# Patient Record
Sex: Male | Born: 1990 | Race: White | Hispanic: No | Marital: Married | Smoking: Never smoker
Health system: Southern US, Community
[De-identification: ages and names within clinical notes are randomized; demographics above are authoritative.]

## PROBLEM LIST (undated history)

## (undated) DIAGNOSIS — K449 Diaphragmatic hernia without obstruction or gangrene: Secondary | ICD-10-CM

## (undated) DIAGNOSIS — Z9289 Personal history of other medical treatment: Secondary | ICD-10-CM

## (undated) DIAGNOSIS — M542 Cervicalgia: Secondary | ICD-10-CM

## (undated) DIAGNOSIS — K219 Gastro-esophageal reflux disease without esophagitis: Secondary | ICD-10-CM

## (undated) DIAGNOSIS — K2970 Gastritis, unspecified, without bleeding: Secondary | ICD-10-CM

## (undated) HISTORY — DX: Gastritis, unspecified, without bleeding: K29.70

## (undated) HISTORY — DX: Cervicalgia: M54.2

## (undated) HISTORY — DX: Diaphragmatic hernia without obstruction or gangrene: K44.9

## (undated) HISTORY — DX: Personal history of other medical treatment: Z92.89

---

## 2011-09-20 ENCOUNTER — Ambulatory Visit (INDEPENDENT_AMBULATORY_CARE_PROVIDER_SITE_OTHER): Payer: 59 | Admitting: Family Medicine

## 2011-09-20 ENCOUNTER — Encounter: Payer: Self-pay | Admitting: Family Medicine

## 2011-09-20 VITALS — BP 114/70 | HR 70 | Ht 68.5 in | Wt 142.0 lb

## 2011-09-20 DIAGNOSIS — Z0000 Encounter for general adult medical examination without abnormal findings: Secondary | ICD-10-CM

## 2011-09-20 DIAGNOSIS — M542 Cervicalgia: Secondary | ICD-10-CM

## 2011-09-20 DIAGNOSIS — R110 Nausea: Secondary | ICD-10-CM

## 2011-09-20 DIAGNOSIS — M549 Dorsalgia, unspecified: Secondary | ICD-10-CM

## 2011-09-20 NOTE — Patient Instructions (Signed)
heat, stretching and anti-inflammatory twice. Try Prilosec and let me know how this works.

## 2011-09-20 NOTE — Progress Notes (Signed)
Subjective:    Patient ID: Richard Trujillo, male    DOB: 12-09-1990, 21 y.o.   MRN: [id number]  HPI He is here for complete examination. Approximately one month ago he did notice a tearing sensation in the left neck and head pain after that but no numbness, tingling or weakness. The symptoms are improved. He also complains of a long history of low back discomfort. He does treat this symptomatically and has had no major difficulties from this. He also has difficulty with nausea. He's had no vomiting or diarrhea. Food sometimes makes it better and at other times worse. He does say that Pepto-Bismol has helped. This has been going on for several months. He also has a rash present on his left lateral leg that he has been using an antifungal on for the last 2 months. He is now no longer having any difficulty. He did have some routine blood screening done at work.   Review of Systems  Constitutional: Negative.   HENT: Negative.   Eyes: Negative.   Respiratory: Negative.   Cardiovascular: Negative.   Genitourinary: Negative.   Musculoskeletal: Positive for back pain.  Skin: Negative.   Hematological: Negative.   Psychiatric/Behavioral: Negative.        Objective:   Physical Exam BP 114/70  Pulse 70  Ht 5' 8.5" (1.74 m)  Wt 142 lb (64.411 kg)  BMI 21.28 kg/m2  General Appearance:    Alert, cooperative, no distress, appears stated age  Head:    Normocephalic, without obvious abnormality, atraumatic  Eyes:    PERRL, conjunctiva/corneas clear, EOM's intact, fundi    benign  Ears:    Normal TM's and external ear canals  Nose:   Nares normal, mucosa normal, no drainage or sinus   tenderness  Throat:   Lips, mucosa, and tongue normal; teeth and gums normal  Neck:   Supple, no lymphadenopathy;  thyroid:  no   enlargement/tenderness/nodules; no carotid   bruit or JVD  Back:    Spine nontender, no curvature, ROM normal, no CVA     tenderness  Lungs:     Clear to auscultation bilaterally without  wheezes, rales or     ronchi; respirations unlabored  Chest Wall:    No tenderness or deformity   Heart:    Regular rate and rhythm, S1 and S2 normal, no murmur, rub   or gallop  Breast Exam:    No chest wall tenderness, masses or gynecomastia  Abdomen:     Soft, non-tender, nondistended, normoactive bowel sounds,    no masses, no hepatosplenomegaly  Genitalia:    Normal male external genitalia without lesions.  Testicles without masses.  No inguinal hernias.  Rectal:   Deferred due to age <40 and lack of symptoms  Extremities:   No clubbing, cyanosis or edema  Pulses:   2+ and symmetric all extremities  Skin:   Skin color, texture, turgor normal, no rashes or lesions  Lymph nodes:   Cervical, supraclavicular, and axillary nodes normal  Neurologic:   CNII-XII intact, normal strength, sensation and gait; reflexes 2+ and symmetric throughout          Psych:   Normal mood, affect, hygiene and grooming.           Assessment & Plan:   1. Routine general medical examination at a health care facility   2. Neck pain, acute   3. Nausea alone   4. Back pain    recommend heat, stretching and anti-inflammatory. Also discussed proper  posturing and strengthening. Recommend Prilosec for the nausea. If the skin lesion reoccurs, recommend he continue on the antifungal medication.

## 2011-10-12 ENCOUNTER — Telehealth: Payer: Self-pay

## 2011-10-12 NOTE — Telephone Encounter (Signed)
He called last night because of difficulty with abdominal pain. He was taking Prilosec which helped with his reflux symptoms but he thinks caused the pain. I switched him to Nexium and the pain is gone and now he has a pressure sensation in the midepigastric area. I encouraged him to stay on the Nexium and call me at the beginning of the week.

## 2011-10-12 NOTE — Telephone Encounter (Signed)
nexium help some last nite and today still doesn't feel right feeling tight please advise on what to do he took the rest of the day off today please advise

## 2011-10-17 ENCOUNTER — Telehealth: Payer: Self-pay | Admitting: Internal Medicine

## 2011-10-17 NOTE — Telephone Encounter (Signed)
He states that he is roughly 70% better. I will have him continue on the medication for at least the next 2 weeks. He is then to taper back on his medication to every other day and then to every 3 days to keep his symptoms under control. If this does not work he is to call me.

## 2011-10-18 ENCOUNTER — Other Ambulatory Visit: Payer: Self-pay

## 2011-10-18 ENCOUNTER — Emergency Department (HOSPITAL_COMMUNITY)
Admission: EM | Admit: 2011-10-18 | Discharge: 2011-10-19 | Disposition: A | Discharge status: Home Health | Payer: 59 | Attending: Emergency Medicine | Admitting: Emergency Medicine

## 2011-10-18 ENCOUNTER — Encounter (HOSPITAL_COMMUNITY): Payer: Self-pay | Admitting: *Deleted

## 2011-10-18 DIAGNOSIS — R071 Chest pain on breathing: Secondary | ICD-10-CM | POA: Insufficient documentation

## 2011-10-18 DIAGNOSIS — R0781 Pleurodynia: Secondary | ICD-10-CM

## 2011-10-18 NOTE — ED Notes (Signed)
Pt states  He started to have sharp left chest pain about two hours ago. Pt states he has some numbness in his left hand. Pt denies any sob or n/v. Pt states he was started on nexium last week

## 2011-10-19 ENCOUNTER — Emergency Department (HOSPITAL_COMMUNITY): Payer: 59

## 2011-10-19 ENCOUNTER — Ambulatory Visit (INDEPENDENT_AMBULATORY_CARE_PROVIDER_SITE_OTHER): Payer: 59 | Admitting: Family Medicine

## 2011-10-19 ENCOUNTER — Telehealth: Payer: Self-pay

## 2011-10-19 VITALS — BP 126/88 | HR 82 | Ht 68.5 in | Wt 137.0 lb

## 2011-10-19 DIAGNOSIS — M940 Chondrocostal junction syndrome [Tietze]: Secondary | ICD-10-CM

## 2011-10-19 LAB — POCT I-STAT TROPONIN I: Troponin i, poc: 0 ng/mL (ref 0.00–0.08)

## 2011-10-19 LAB — POCT I-STAT, CHEM 8
BUN: 10 mg/dL (ref 6–23)
Calcium, Ion: 1.19 mmol/L (ref 1.12–1.32)
Chloride: 103 mEq/L (ref 96–112)
Creatinine, Ser: 0.9 mg/dL (ref 0.50–1.35)
Glucose, Bld: 106 mg/dL — ABNORMAL HIGH (ref 70–99)
HCT: 45 % (ref 39.0–52.0)
Hemoglobin: 15.3 g/dL (ref 13.0–17.0)
Potassium: 3.5 mEq/L (ref 3.5–5.1)
Sodium: 141 mEq/L (ref 135–145)
TCO2: 25 mmol/L (ref 0–100)

## 2011-10-19 MED ORDER — NAPROXEN 500 MG PO TABS: 500.0000 mg | ORAL_TABLET | Freq: Two times a day (BID) | ORAL | Status: DC

## 2011-10-19 NOTE — Progress Notes (Signed)
  Subjective:    Patient ID: Richard Trujillo, male    DOB: July 04, 1991, 21 y.o.   MRN: [id number]  HPI He was seen last night in the emergency room for evaluation of intermittent left-sided pleuritic type chest pain. The emergency room record was reviewed. He does continue to have difficulty with this. He describes the pain is worse with breathing and also there is some slight point tenderness over the left costochondral junction on at least 2 different sites. He's had no shortness of breath, diaphoresis, weakness. He continues to have difficulty with his abdominal pain but states that he is doing much better than when it initially started.   Review of Systems     Objective:   Physical Exam Alert and in no distress. Slight chest wall tenderness noted over the third and fourth costochondral junctions. Lungs clear to auscultation. Cardiac exam shows regular rhythm without murmurs or gallops.       Assessment & Plan:   1. Acute costochondritis    recommend he continue on naproxen. Also discussed possibly adding an H2 blocker during the middle of the day however if continued difficulty, referral will be made to GI.

## 2011-10-19 NOTE — Telephone Encounter (Signed)
Pt coming in

## 2011-10-19 NOTE — Patient Instructions (Signed)
Stay on naproxen and had 2 Tylenol 4 times per day

## 2011-10-19 NOTE — Discharge Instructions (Signed)
Your x-ray, EKG and blood work have all been normal. Please take Naprosyn or ibuprofen for pain, followup with your doctor as needed. Please see the attached reading instructions regarding your chest pain. If your symptoms should worsen or become severe return to the emergency department immediately

## 2011-10-19 NOTE — ED Provider Notes (Signed)
History     CSN: [id number]  Arrival date & time 10/18/11  2251   First MD Initiated Contact with Patient 10/19/11 0008      Chief Complaint  Patient presents with  . Chest Pain    (Consider location/radiation/quality/duration/timing/severity/associated sxs/prior treatment) HPI Comments: 21 year old male with no significant past medical history presents with sharp left-sided chest pain. This was acute in onset while he was laying down for the evening. It is intermittent, lasts approximately 1-2 seconds and resolves. It happens multiple times an hour and seems to be worse with moving from side to side, stretching his arms. It is not related with taking a deep breath, there's been no fevers shortness of breath coughing abdominal pain or swelling in the legs. There is been no trauma, travel, surgery, immobilization, hormone use, smoking.  Patient is a 21 y.o. male presenting with chest pain. The history is provided by the patient.  Chest Pain Pertinent negatives for primary symptoms include no fever, no shortness of breath, no cough, no abdominal pain, no nausea and no vomiting.  Pertinent negatives for associated symptoms include no numbness and no weakness.     History reviewed. No pertinent past medical history.  History reviewed. No pertinent past surgical history.  No family history on file.  History  Substance Use Topics  . Smoking status: Never Smoker   . Smokeless tobacco: Never Used  . Alcohol Use: No      Review of Systems  Constitutional: Negative for fever and chills.  HENT: Negative for sore throat and neck pain.   Eyes: Negative for visual disturbance.  Respiratory: Negative for cough and shortness of breath.   Cardiovascular: Positive for chest pain.  Gastrointestinal: Negative for nausea, vomiting, abdominal pain and diarrhea.  Genitourinary: Negative for dysuria and frequency.  Musculoskeletal: Negative for back pain.  Skin: Negative for rash.    Neurological: Negative for weakness, numbness and headaches.  Hematological: Negative for adenopathy.  Psychiatric/Behavioral: Negative for behavioral problems.    Allergies  Review of patient's allergies indicates no known allergies.  Home Medications   Current Outpatient Rx  Name Route Sig Dispense Refill  . ESOMEPRAZOLE MAGNESIUM 40 MG PO CPDR Oral Take 40 mg by mouth daily before breakfast.    . NAPROXEN 500 MG PO TABS Oral Take 1 tablet (500 mg total) by mouth 2 (two) times daily with a meal. 30 tablet 0    BP 132/75  Pulse 95  Temp 98.6 F (37 C)  Resp 22  SpO2 100%  Physical Exam  Nursing note and vitals reviewed. Constitutional: He appears well-developed and well-nourished. No distress.  HENT:  Head: Normocephalic and atraumatic.  Mouth/Throat: Oropharynx is clear and moist. No oropharyngeal exudate.  Eyes: Conjunctivae and EOM are normal. Pupils are equal, round, and reactive to light. Right eye exhibits no discharge. Left eye exhibits no discharge. No scleral icterus.  Neck: Normal range of motion. Neck supple. No JVD present. No thyromegaly present.  Cardiovascular: Normal rate, regular rhythm, normal heart sounds and intact distal pulses.  Exam reveals no gallop and no friction rub.   No murmur heard. Pulmonary/Chest: Effort normal and breath sounds normal. No respiratory distress. He has no wheezes. He has no rales.  Abdominal: Soft. Bowel sounds are normal. He exhibits no distension and no mass. There is no tenderness.  Musculoskeletal: Normal range of motion. He exhibits no edema and no tenderness.  Lymphadenopathy:    He has no cervical adenopathy.  Neurological: He is alert. Coordination  normal.  Skin: Skin is warm and dry. No rash noted. No erythema.  Psychiatric: He has a normal mood and affect. His behavior is normal.    ED Course  Procedures (including critical care time)  ED ECG REPORT   Date: 10/19/2011   Rate: 97  Rhythm: normal sinus  rhythm  QRS Axis: normal  Intervals: normal  ST/T Wave abnormalities: normal  Conduction Disutrbances:none  Narrative Interpretation:   Old EKG Reviewed: none available   Labs Reviewed  POCT I-STAT, CHEM 8 - Abnormal; Notable for the following:    Glucose, Bld 106 (*)    All other components within normal limits  POCT I-STAT TROPONIN I   Dg Chest 2 View  10/19/2011  *RADIOLOGY REPORT*  Clinical Data: Sudden onset left chest pain.  CHEST - 2 VIEW  Comparison: None.  Findings: Normal sized heart.  Clear lungs with normal vascularity. Minimal central peribronchial thickening.  Normal appearing bones.  IMPRESSION: Minimal bronchitic changes.  Original Report Authenticated By: Darrol Angel, M.D.     1. Pleuritic chest pain       MDM  Pulmonary cardiac exams both are normal without murmurs rubs gallops or tachycardia. He has no swelling in the lower extremities, the pain is very short-lived lasting one to 2 seconds at the most, there is no shortness of breath cough fever or swelling of the legs. His EKG appears normal with a pulse of 97 no significant other maladies. I suspect he is having a small amount of pleurisy. Proceed with chest x-ray, troponin, chemistry. Patient has perc negative    X-ray reviewed by myself showing no acute findings, vital signs again appear normal, patient appears calm and comfortable, laboratory workup shows normal troponin, negative electrolytes, EKG was unremarkable. Will discharge home as patient is extremely low risk for pulmonary embolism and cardiac disease  Vida Roller, MD 10/19/11 0110

## 2011-10-19 NOTE — ED Notes (Signed)
Patient transported to X-ray 

## 2011-10-19 NOTE — ED Notes (Signed)
Pt states pain to L mid/upper chest. Denies radiation. Pain intermittent. Describes as tight. Pt was at rest when pain started. Pt is a non-smoker.

## 2011-10-21 ENCOUNTER — Telehealth: Payer: Self-pay | Admitting: *Deleted

## 2011-10-21 ENCOUNTER — Ambulatory Visit (INDEPENDENT_AMBULATORY_CARE_PROVIDER_SITE_OTHER): Payer: 59 | Admitting: Internal Medicine

## 2011-10-21 ENCOUNTER — Encounter: Payer: Self-pay | Admitting: Internal Medicine

## 2011-10-21 VITALS — BP 102/62 | HR 60 | Ht 68.0 in | Wt 136.0 lb

## 2011-10-21 DIAGNOSIS — R1013 Epigastric pain: Secondary | ICD-10-CM

## 2011-10-21 DIAGNOSIS — R630 Anorexia: Secondary | ICD-10-CM

## 2011-10-21 MED ORDER — HYOSCYAMINE SULFATE 0.125 MG SL SUBL: SUBLINGUAL_TABLET | SUBLINGUAL | Status: DC

## 2011-10-21 NOTE — Patient Instructions (Signed)
You have been scheduled for an upper endoscopy on 10/24/11.  Please see the separate instructions provided to you at your office visit I have sent your prescription to your pharmacy You have been scheduled for an abdominal ultrasound at [facility] 10/25/11 8:00.  Please arrive at 7:45 and register in the Radiology department.  You need to have nothing to eat or drink after midnight on 10/25/11

## 2011-10-21 NOTE — Telephone Encounter (Signed)
Moved patient to an earlier available appointment.  He will arrive at 1330 for a 1430 appointment.

## 2011-10-21 NOTE — Progress Notes (Signed)
Subjective:    Patient ID: Richard Trujillo, male    DOB: 04-22-1991, 21 y.o.   MRN: [id number]  HPI Richard Trujillo is a 21 yo male with no significant PMH who is seen in consultation at the request of Dr. Susann Givens for evaluation of epigastric abd pain.  The patient reports epigastric abdominal pain over the last several months. He also describes this as a "upset and uncomfortable" stomach. He first noticed this a year ago, and it lasted a couple months and seem to resolve. At that point he took no treatment or medication. Than a couple months ago this returned, initially he took omeprazole x14 days OTC which seemed to help the pain. After the medication was stopped he developed more severe epigastric pain, seeking care at that point. He was started on Nexium 40 mg daily, and he reports some improvement, however subsequently his pain worsened again. This is a daily pain for him, and it tends to be worse in the early afternoon. He doesn't think it really relates to being, and doesn't seem to relate to bowel movement. Occasionally it is worse with deep breath or movement. His bowel movements have been normal for him and he denies diarrhea or constipation. No blood per rectum or melena. He reports some subjective fevers and chills, but notes he has not measured his temperature. He denies nausea or vomiting. No dysphagia. No heartburn. He does report significant anorexia without significant weight loss. He does report frequent belching. He was seen recently by primary care in the emergency department and has been given treatment with Naprosyn for possible costochondritis. He also was told he may have pleurisy.  Review of Systems Constitutional: See HPI HEENT: Negative for sore throat, mouth sores and trouble swallowing. Eyes: Negative for visual disturbance Respiratory: Positive for nonproductive cough x 1 day, no chest tightness and positive for mild shortness of breath Cardiovascular: Negative for chest pain,  palpitations and lower extremity swelling Gastrointestinal: See history of present illness Genitourinary: Negative for dysuria and hematuria. Musculoskeletal: Negative for back pain, arthralgias and myalgias Skin: Negative for rash or color change Neurological: Negative for headaches, weakness, numbness Hematological: Negative for adenopathy, negative for easy bruising/bleeding Psychiatric/behavioral: Negative for depressed mood, negative for anxiety   History reviewed. No pertinent past medical history.  Current Outpatient Prescriptions  Medication Sig Dispense Refill  . esomeprazole (NEXIUM) 40 MG capsule Take 40 mg by mouth daily before breakfast.      . hyoscyamine (LEVSIN/SL) 0.125 MG SL tablet Place 1-2 tablets under your tongue every 4-6 hours for abdominal pain  30 tablet  0   No Known Allergies  Family History  Problem Relation Age of Onset  . Heart disease Paternal Grandmother     Social History  . Marital Status: Single   Occupational History  . [employer]   Social History Main Topics  . Smoking status: Never Smoker   . Smokeless tobacco: Never Used  . Alcohol Use: No  . Drug Use: No  . Sexually Active: Yes    Birth Control/ Protection: Condom, Diaphragm      Objective:   Physical Exam BP 102/62  Pulse 60  Ht 5\' 8"  (1.727 m)  Wt 136 lb (61.689 kg)  BMI 20.68 kg/m2 Constitutional: Well-developed and well-nourished. No distress. HEENT: Normocephalic and atraumatic. Oropharynx is clear and moist. No oropharyngeal exudate. Conjunctivae are normal. Pupils are equal round and reactive to light. No scleral icterus. Neck: Neck supple. Trachea midline. Cardiovascular: Normal rate, regular rhythm and  intact distal pulses. No M/R/G Pulmonary/chest: Effort normal and breath sounds normal. No wheezing, rales or rhonchi. There is no significant tenderness to palpation over the ribs or sternum Abdominal: Soft, mild epigastric pain to deep palpation without rebound  or guarding, nondistended. Bowel sounds active throughout. There are no masses palpable. No hepatosplenomegaly. Extremities: no clubbing, cyanosis, or edema Lymphadenopathy: No cervical adenopathy noted. Neurological: Alert and oriented to person place and time. Skin: Skin is warm and dry. No rashes noted. Psychiatric: Normal mood and affect. Behavior is normal.  CBC    Component Value Date/Time   HGB 15.3 10/19/2011 0052   HCT 45.0 10/19/2011 0052   CMP     Component Value Date/Time   NA 141 10/19/2011 0052   K 3.5 10/19/2011 0052   CL 103 10/19/2011 0052   GLUCOSE 106* 10/19/2011 0052   BUN 10 10/19/2011 0052   CREATININE 0.90 10/19/2011 0052   Imaging Reviewed; CHEST - 2 VIEW, 10/19/11   Comparison: None.   Findings: Normal sized heart.  Clear lungs with normal vascularity. Minimal central peribronchial thickening.  Normal appearing bones.   IMPRESSION: Minimal bronchitic changes.     Assessment & Plan:  21 yo male with no significant PMH who is seen in consultation at the request of Dr. Susann Givens for evaluation of epigastric abd pain  1. Epigastric pain -- the patient's pain seems to have responded to PPI at first, however is no longer. The differential includes peptic ulcer disease, gastritis, or even gallbladder disease, but his constellation of symptoms are somewhat nonspecific. It is also possible that his symptoms are not GI related at all. We have discussed further workup, and I have recommended complete abdominal ultrasound and an upper endoscopy. We discussed EGD today including the risks and benefits and he is agreeable to proceed. I like him to continue Nexium 40 mg daily, and I've instructed him to take this 30 minutes to one hour before his first meal. I also will give him a trial of Levsin 0.125 mg every 4-6 hours when necessary abdominal pain/spasm. Vastus she call back if his symptoms change or worsen prior to this workup. He voiced understanding.

## 2011-10-24 ENCOUNTER — Encounter: Payer: Self-pay | Admitting: Internal Medicine

## 2011-10-24 ENCOUNTER — Other Ambulatory Visit: Payer: 59 | Admitting: Internal Medicine

## 2011-10-24 ENCOUNTER — Ambulatory Visit (AMBULATORY_SURGERY_CENTER): Payer: 59 | Admitting: Internal Medicine

## 2011-10-24 VITALS — BP 128/70 | HR 80 | Temp 98.1°F | Resp 20 | Ht 68.0 in | Wt 136.0 lb

## 2011-10-24 DIAGNOSIS — R1013 Epigastric pain: Secondary | ICD-10-CM

## 2011-10-24 DIAGNOSIS — K2970 Gastritis, unspecified, without bleeding: Secondary | ICD-10-CM

## 2011-10-24 DIAGNOSIS — K2990 Gastroduodenitis, unspecified, without bleeding: Secondary | ICD-10-CM

## 2011-10-24 DIAGNOSIS — D131 Benign neoplasm of stomach: Secondary | ICD-10-CM

## 2011-10-24 HISTORY — PX: ESOPHAGOGASTRODUODENOSCOPY: SHX1529

## 2011-10-24 MED ORDER — SODIUM CHLORIDE 0.9 % IV SOLN: 500.0000 mL | INTRAVENOUS | Status: DC

## 2011-10-24 NOTE — Patient Instructions (Signed)
YOU HAD AN ENDOSCOPIC PROCEDURE TODAY AT THE [facility]: Refer to the procedure report that was given to you for any specific questions about what was found during the examination.  If the procedure report does not answer your questions, please call your gastroenterologist to clarify.  If you requested that your care partner not be given the details of your procedure findings, then the procedure report has been included in a sealed envelope for you to review at your convenience later.  YOU SHOULD EXPECT: Some feelings of bloating in the abdomen. Passage of more gas than usual.  Walking can help get rid of the air that was put into your GI tract during the procedure and reduce the bloating. If you had a lower endoscopy (such as a colonoscopy or flexible sigmoidoscopy) you may notice spotting of blood in your stool or on the toilet paper. If you underwent a bowel prep for your procedure, then you may not have a normal bowel movement for a few days.  DIET: Your first meal following the procedure should be a light meal and then it is ok to progress to your normal diet.  A half-sandwich or bowl of soup is an example of a good first meal.  Heavy or fried foods are harder to digest and may make you feel nauseous or bloated.  Likewise meals heavy in dairy and vegetables can cause extra gas to form and this can also increase the bloating.  Drink plenty of fluids but you should avoid alcoholic beverages for 24 hours.  ACTIVITY: Your care partner should take you home directly after the procedure.  You should plan to take it easy, moving slowly for the rest of the day.  You can resume normal activity the day after the procedure however you should NOT DRIVE or use heavy machinery for 24 hours (because of the sedation medicines used during the test).    SYMPTOMS TO REPORT IMMEDIATELY: A gastroenterologist can be reached at any hour.  During normal business hours, 8:30 AM to 5:00 PM Monday through Friday,  call [phone].  After hours and on weekends, please call the GI answering service at [phone] who will take a message and have the physician on call contact you.  Following upper endoscopy (EGD)  Vomiting of blood or coffee ground material  New chest pain or pain under the shoulder blades  Painful or persistently difficult swallowing  New shortness of breath  Fever of 100F or higher  Black, tarry-looking stools  FOLLOW UP: If any biopsies were taken you will be contacted by phone or by letter within the next 1-3 weeks.  Call your gastroenterologist if you have not heard about the biopsies in 3 weeks.  Our staff will call the home number listed on your records the next business day following your procedure to check on you and address any questions or concerns that you may have at that time regarding the information given to you following your procedure. This is a courtesy call and so if there is no answer at the home number and we have not heard from you through the emergency physician on call, we will assume that you have returned to your regular daily activities without incident.  SIGNATURES/CONFIDENTIALITY: You and/or your care partner have signed paperwork which will be entered into your electronic medical record.  These signatures attest to the fact that that the information above on your After Visit Summary has been reviewed and is understood.  Full responsibility of   the confidentiality of this discharge information lies with you and/or your care-partner. 

## 2011-10-24 NOTE — Progress Notes (Signed)
Patient did not experience any of the following events: a burn prior to discharge; a fall within the facility; wrong site/side/patient/procedure/implant event; or a hospital transfer or hospital admission upon discharge from the facility. (G8907) Patient did not have preoperative order for IV antibiotic SSI prophylaxis. (G8918)  

## 2011-10-24 NOTE — Op Note (Signed)
[facility] [address] [place], [place]  [address]  ENDOSCOPY PROCEDURE REPORT  PATIENT:  Richard Trujillo, Richard Trujillo  MR#:  [id number] BIRTHDATE:  17-Nov-1990, 20 yrs. old  GENDER:  male ENDOSCOPIST:  Carie Caddy. Zadie Deemer, MD Referred by:  Sharlot Gowda, M.D. PROCEDURE DATE:  10/24/2011 PROCEDURE:  EGD with biopsy, 43239 ASA CLASS:  Class I INDICATIONS:  epigastric pain MEDICATIONS:  These medications were titrated to patient response per physician's verbal order, Versed 10 mg IV, Fentanyl 100 mcg IV, Benadryl 25 mg IV TOPICAL ANESTHETIC:  Cetacaine Spray  DESCRIPTION OF PROCEDURE:   After the risks benefits and alternatives of the procedure were thoroughly explained, informed consent was obtained.  The [facility] [id number] endoscope was introduced through the mouth and advanced to the second portion of the duodenum, without limitations.  The instrument was slowly withdrawn as the mucosa was fully examined. <<PROCEDUREIMAGES>> The esophagus and gastroesophageal junction were completely normal in appearance.  A small hiatal hernia was found.  Mild gastritis was found in the body and the antrum of the stomach. Multiple biopsies were obtained and sent to pathology.  The duodenal bulb was normal in appearance, as was the postbulbar duodenum. Retroflexed views revealed a hiatal hernia.    The scope was then withdrawn from the patient and the procedure completed.  COMPLICATIONS:  None  ENDOSCOPIC IMPRESSION: 1) Normal esophagus 2) Small hiatal hernia 3) Mild gastritis in the body and the antrum of the stomach. Multiple biopsies taken and sent to pathology. 4) Normal duodenum  RECOMMENDATIONS: 1) Await pathology results 2) Continue current medications 3) Await results of abdominal ultrasound. Carie Caddy. Rhea Belton, MD  CC:  Sharlot Gowda, MD The Patient  n. eSIGNED:   Carie Caddy. Kristy Schomburg at 10/24/2011 02:53 PM  Berna Spare, [phone]

## 2011-10-25 ENCOUNTER — Telehealth: Payer: Self-pay | Admitting: *Deleted

## 2011-10-25 ENCOUNTER — Ambulatory Visit (HOSPITAL_COMMUNITY)
Admission: RE | Admit: 2011-10-25 | Discharge: 2011-10-25 | Disposition: A | Discharge status: Home / Self Care | Payer: 59 | Source: Ambulatory Visit | Attending: Internal Medicine | Admitting: Internal Medicine

## 2011-10-25 DIAGNOSIS — R1013 Epigastric pain: Secondary | ICD-10-CM | POA: Insufficient documentation

## 2011-10-25 NOTE — Telephone Encounter (Signed)
  Follow up Call-  Call back number 10/24/2011  Post procedure Call Back phone  # [phone]  Permission to leave phone message Yes     Number is no longer in service.

## 2011-10-31 ENCOUNTER — Encounter: Payer: Self-pay | Admitting: Internal Medicine

## 2011-11-02 ENCOUNTER — Telehealth: Payer: Self-pay | Admitting: *Deleted

## 2011-11-02 NOTE — Telephone Encounter (Signed)
I'm glad to hear he is better. I think Pepcid Complete on an as-needed basis is may be all he needs. This can be used as needed for dyspeptic symptoms and heartburn. He can stop his Nexium or other PPI for now Please tell him to call us back if things worsen or he has any other concerns/questions

## 2011-11-02 NOTE — Telephone Encounter (Signed)
Letter from: Beverley Fiedler Reason for Letter: Results Review Comments: 10/31/11 Richard Trujillo, can you please call Richard Trujillo to see if his symptoms have improved overall, and if not we need to reevaluate.  Pt reports he's doing a lot better. He is trying to introduce new food in his diet, but he does best with a bland diet of grilled foods and no spices. When he feels himself getting an upset stomach or has increased burping, he takes a Pepcid Complete which seems to be working. He has stopped the Nexium d/t chest pain when taking the drug. He does state he thinks the Nexium helped with the problems Prilosec caused. Explained to pt Nexium  and Prilosec are different types of acid controllers and maybe he could tolerate Pepcid or Zantac. Please advise on a drug for reflux. Thanks.

## 2011-11-02 NOTE — Telephone Encounter (Signed)
Informed pt of Dr Lauro Franklin recommendations and to call if he has questions or GI concerns; pt stated understanding.

## 2011-11-30 ENCOUNTER — Ambulatory Visit (HOSPITAL_COMMUNITY)
Admission: RE | Admit: 2011-11-30 | Discharge: 2011-11-30 | Disposition: A | Discharge status: Home / Self Care | Payer: 59 | Source: Ambulatory Visit | Attending: Family Medicine | Admitting: Family Medicine

## 2011-11-30 ENCOUNTER — Encounter: Payer: Self-pay | Admitting: Family Medicine

## 2011-11-30 ENCOUNTER — Ambulatory Visit (INDEPENDENT_AMBULATORY_CARE_PROVIDER_SITE_OTHER): Payer: 59 | Admitting: Family Medicine

## 2011-11-30 VITALS — BP 116/80 | HR 75 | Wt 136.0 lb

## 2011-11-30 DIAGNOSIS — R109 Unspecified abdominal pain: Secondary | ICD-10-CM

## 2011-11-30 DIAGNOSIS — M542 Cervicalgia: Secondary | ICD-10-CM | POA: Insufficient documentation

## 2011-11-30 DIAGNOSIS — B354 Tinea corporis: Secondary | ICD-10-CM

## 2011-11-30 MED ORDER — NAPROXEN SODIUM ER 750 MG PO TB24: 1.0000 | ORAL_TABLET | ORAL | Status: DC

## 2011-11-30 NOTE — Progress Notes (Signed)
  Subjective:    Patient ID: Richard Trujillo, male    DOB: 1991-05-15, 21 y.o.   MRN: [id number]  HPI He is here for consultation. He has a 4 month history of difficulty with neck pain. There is no initial injury to this. He did get better over approximately 2 weeks ago he has noted increased pain especially on the left-hand side with occasional radiation down to the fifth finger. He's had no weakness. He also states that his abdominal symptoms are doing much better since he is now taking a probiotic. He also has been using an antifungal medicine on the lesion on his left lateral calf. It has taken several months for this to get better and tends to recur.   Review of Systems     Objective:   Physical Exam Full motion of the neck with pain with flexion and extension as well as lateral motion on the extremes. Normal motor, sensory and DTRs. Trigger point noted in the left mid trapezius. Exam of his left lateral calf does show a very slight discoloration approximately 4 cm in size.       Assessment & Plan:   1. Neck pain on left side  DG Cervical Spine Complete, Ambulatory referral to Physical Therapy, Naproxen Sodium (NAPRELAN) 750 MG TB24  2. Tinea corporis    3. Abdominal pain     he is to use Lamisil to help with his tinea. The abdominal pain seems to be doing well therefore no further evaluation necessary. The x-ray was negative and therefore he will be sent for physical therapy. Also discussed appropriate followup with possible MRI and injections.

## 2011-11-30 NOTE — Patient Instructions (Signed)
Use Lamisil on your leg. Continue with heat 20 minutes 3 times per day. Use the Naprelan. We will also such up for physical therapy if the x-ray is negative

## 2011-12-10 ENCOUNTER — Emergency Department (HOSPITAL_COMMUNITY)
Admission: EM | Admit: 2011-12-10 | Discharge: 2011-12-11 | Disposition: A | Discharge status: Home / Self Care | Payer: 59 | Attending: Emergency Medicine | Admitting: Emergency Medicine

## 2011-12-10 ENCOUNTER — Emergency Department (HOSPITAL_COMMUNITY): Payer: 59

## 2011-12-10 ENCOUNTER — Encounter (HOSPITAL_COMMUNITY): Payer: Self-pay | Admitting: Emergency Medicine

## 2011-12-10 DIAGNOSIS — K5900 Constipation, unspecified: Secondary | ICD-10-CM | POA: Insufficient documentation

## 2011-12-10 DIAGNOSIS — K219 Gastro-esophageal reflux disease without esophagitis: Secondary | ICD-10-CM | POA: Insufficient documentation

## 2011-12-10 DIAGNOSIS — R109 Unspecified abdominal pain: Secondary | ICD-10-CM | POA: Insufficient documentation

## 2011-12-10 DIAGNOSIS — K2970 Gastritis, unspecified, without bleeding: Secondary | ICD-10-CM | POA: Insufficient documentation

## 2011-12-10 HISTORY — DX: Gastro-esophageal reflux disease without esophagitis: K21.9

## 2011-12-10 MED ORDER — GI COCKTAIL ~~LOC~~
30.0000 mL | Freq: Once | ORAL | Status: AC
  Administered 2011-12-11: 30 mL via ORAL
  Filled 2011-12-10: qty 30

## 2011-12-10 NOTE — ED Notes (Signed)
Pt c/o abd pain x 1.5 months, worse last 3 days. States this pain feels different than previous pain, pt not currently taking any meds. Pt also c/o L sided back pain

## 2011-12-10 NOTE — ED Notes (Signed)
Pt c/o intermittent epigastric pain starting yesterday. Denies n/v. Diarrhea on Wed and Thurs when s/s first began. Pt has hx of GI problems. Denies dietary changes.

## 2011-12-10 NOTE — ED Notes (Signed)
Pt denies N/V. Pt states he did have diarrhea Wed/Thurs with streaks of bright red blood

## 2011-12-11 LAB — POCT I-STAT, CHEM 8
BUN: 11 mg/dL (ref 6–23)
Calcium, Ion: 1.27 mmol/L (ref 1.12–1.32)
Chloride: 101 mEq/L (ref 96–112)
Creatinine, Ser: 1.1 mg/dL (ref 0.50–1.35)
Glucose, Bld: 87 mg/dL (ref 70–99)
HCT: 45 % (ref 39.0–52.0)
Hemoglobin: 15.3 g/dL (ref 13.0–17.0)
Potassium: 3.5 mEq/L (ref 3.5–5.1)
Sodium: 141 mEq/L (ref 135–145)
TCO2: 27 mmol/L (ref 0–100)

## 2011-12-11 LAB — CBC
HCT: 42 % (ref 39.0–52.0)
Hemoglobin: 14.3 g/dL (ref 13.0–17.0)
MCH: 29.4 pg (ref 26.0–34.0)
MCHC: 34 g/dL (ref 30.0–36.0)
MCV: 86.4 fL (ref 78.0–100.0)
Platelets: 307 10*3/uL (ref 150–400)
RBC: 4.86 MIL/uL (ref 4.22–5.81)
RDW: 12.2 % (ref 11.5–15.5)
WBC: 7.9 10*3/uL (ref 4.0–10.5)

## 2011-12-11 LAB — DIFFERENTIAL
Basophils Absolute: 0 10*3/uL (ref 0.0–0.1)
Basophils Relative: 0 % (ref 0–1)
Eosinophils Absolute: 0.3 10*3/uL (ref 0.0–0.7)
Eosinophils Relative: 3 % (ref 0–5)
Lymphocytes Relative: 34 % (ref 12–46)
Lymphs Abs: 2.7 10*3/uL (ref 0.7–4.0)
Monocytes Absolute: 0.9 10*3/uL (ref 0.1–1.0)
Monocytes Relative: 12 % (ref 3–12)
Neutro Abs: 4 10*3/uL (ref 1.7–7.7)
Neutrophils Relative %: 51 % (ref 43–77)

## 2011-12-11 LAB — HEPATIC FUNCTION PANEL
ALT: 19 U/L (ref 0–53)
AST: 17 U/L (ref 0–37)
Albumin: 4.4 g/dL (ref 3.5–5.2)
Alkaline Phosphatase: 61 U/L (ref 39–117)
Bilirubin, Direct: <0.1 mg/dL (ref 0.0–0.3)
Total Bilirubin: 0.3 mg/dL (ref 0.3–1.2)
Total Protein: 7.7 g/dL (ref 6.0–8.3)

## 2011-12-11 LAB — LIPASE, BLOOD: Lipase: 33 U/L (ref 11–59)

## 2011-12-11 MED ORDER — SUCRALFATE 1 GM/10ML PO SUSP: 1.0000 g | Freq: Four times a day (QID) | ORAL | Status: DC

## 2011-12-11 NOTE — Discharge Instructions (Signed)

## 2011-12-11 NOTE — ED Notes (Signed)
D/c instructions reviewed w/ pt - pt denies any further questions or concerns at present.   

## 2011-12-11 NOTE — ED Notes (Signed)
Pt reports intermittent epigastric pain for approx 1 month - pt denies any exacerbating or alleviating factors - pt has had an endoscopy for this w/o any definitive dx as result. Pt admits to taking rx antacids in the past w/o relief. Pt denies nausea or vomiting however has had a few episodes of diarrhea yesterday.

## 2011-12-14 ENCOUNTER — Encounter: Payer: Self-pay | Admitting: Internal Medicine

## 2011-12-14 ENCOUNTER — Ambulatory Visit: Payer: 59 | Attending: Family Medicine | Admitting: Physical Therapy

## 2011-12-14 DIAGNOSIS — IMO0001 Reserved for inherently not codable concepts without codable children: Secondary | ICD-10-CM | POA: Insufficient documentation

## 2011-12-14 DIAGNOSIS — M2569 Stiffness of other specified joint, not elsewhere classified: Secondary | ICD-10-CM | POA: Insufficient documentation

## 2011-12-14 DIAGNOSIS — R293 Abnormal posture: Secondary | ICD-10-CM | POA: Insufficient documentation

## 2011-12-14 DIAGNOSIS — M542 Cervicalgia: Secondary | ICD-10-CM | POA: Insufficient documentation

## 2011-12-15 ENCOUNTER — Ambulatory Visit (INDEPENDENT_AMBULATORY_CARE_PROVIDER_SITE_OTHER): Payer: 59 | Admitting: Internal Medicine

## 2011-12-15 ENCOUNTER — Encounter: Payer: Self-pay | Admitting: Internal Medicine

## 2011-12-15 DIAGNOSIS — K3189 Other diseases of stomach and duodenum: Secondary | ICD-10-CM

## 2011-12-15 DIAGNOSIS — R109 Unspecified abdominal pain: Secondary | ICD-10-CM

## 2011-12-15 DIAGNOSIS — R1013 Epigastric pain: Secondary | ICD-10-CM | POA: Insufficient documentation

## 2011-12-15 MED ORDER — DEXLANSOPRAZOLE 60 MG PO CPDR: 60.0000 mg | DELAYED_RELEASE_CAPSULE | Freq: Every day | ORAL | Status: DC

## 2011-12-15 NOTE — Progress Notes (Signed)
Subjective:    Patient ID: Richard Trujillo, male    DOB: October 22, 1990, 21 y.o.   MRN: [id number]  HPI Mr. Buening is a 21 yo male with little past medical history whom I saw in February 2013 for evaluation of epigastric pain. He had an upper endoscopy which revealed very mild gastritis, biopsies were performed and negative for H. pylori. He also had an abdominal ultrasound which was unremarkable. He returns today for followup. He is alone today. He reports ongoing issues with intermittent epigastric pressure, which taken to the ER several days ago. He reports prior to this ER visit and after his endoscopy, he started on an over-the-counter probiotic and felt like this was really helping. Then over this past weekend he traveled to the beach but developed the upper abdominal pressure and return home. He then went to the ER where workup was largely unrevealing. An abdominal x-ray revealed a moderate colonic stool burden and he was started on MiraLAX 17 g daily. He was also given a prescription for surgical site 1 g 4 times a day for possible gastritis. He has been on the MiraLAX since the ER visit and is now having one bowel movement daily. He feels that his stools are "easier" now, and before they were occurring every 2-3 days. However, he did not feel constipated prior to beginning MiraLAX. Regarding the abdominal pain he feels like movement, such as bending over makes it worse. He is uncertain if eating effects it, but he knows bowel movement is not effective. He does not have nausea or vomiting. There is no dysphagia. He's had no fevers or chills.  Review of Systems As per history of present illness, otherwise negative  Current Medications, Allergies, Past Medical History, Past Surgical History, Family History and Social History were reviewed in [employer] record.      Objective:   Physical Exam BP 100/64  Pulse 59  Ht 5\' 9"  (1.753 m)  Wt 133 lb (60.328 kg)  BMI 19.64  kg/m2 Constitutional: Well-developed and well-nourished. No distress. HEENT: Normocephalic and atraumatic. Oropharynx is clear and moist. No oropharyngeal exudate. Conjunctivae are normal. Pupils are equal round and reactive to light. No scleral icterus. Neck: Neck supple. Trachea midline. Cardiovascular: Normal rate, regular rhythm and intact distal pulses. No M/R/G Pulmonary/chest: Effort normal and breath sounds normal. No wheezing, rales or rhonchi. Abdominal: Soft, mild epigastric tenderness to deep palpation without rebound or guard, nondistended. Bowel sounds active throughout. There are no masses palpable. No hepatosplenomegaly. Extremities: no clubbing, cyanosis, or edema Lymphadenopathy: No cervical adenopathy noted. Neurological: Alert and oriented to person place and time. Skin: Skin is warm and dry. No rashes noted. Psychiatric: Normal mood and affect. Behavior is normal.  Imaging 10/21/11  ABDOMINAL ULTRASOUND COMPLETE   Comparison:  None.   Findings:   Gallbladder:  No gallstones, gallbladder wall thickening, or pericholecystic fluid.   Common Bile Duct:  Within normal limits in caliber.   Liver: No focal mass lesion identified.  Within normal limits in parenchymal echogenicity.   IVC:  Appears normal.   Pancreas:  No abnormality identified.   Spleen:  Within normal limits in size and echotexture.   Right kidney:  Normal in size and parenchymal echogenicity.  No evidence of mass or hydronephrosis.   Left kidney:  Normal in size and parenchymal echogenicity.  No evidence of mass or hydronephrosis.   Abdominal Aorta:  No aneurysm identified.   IMPRESSION: Negative abdominal ultrasound.  Imaging 12/10/11 ACUTE ABDOMEN SERIES (ABDOMEN  2 VIEW & CHEST 1 VIEW)   Comparison: None.   Findings: Moderate colonic stool noted.  No evidence of dilated bowel loops or air fluid levels.  No evidence of pneumoperitoneum. No radiopaque calculi identified.   Heart size  and mediastinal contours are normal.  Both lungs are clear.   IMPRESSION:   1.  No acute findings. 2.  Moderate colonic stool burden.     Assessment & Plan:  21 yo male with little past medical history whom I saw in February 2013 for evaluation of epigastric pain who returns for follow-up.  1. Epigastric pain -- the patient's workup of his epigastric pain has been largely unrevealing today. However, he recently sought care in emergency room for this pain/pressure. He was given sucralfate and MiraLAX discharge from the ER. It seems that the MiraLAX has helped him some, and thus we will continue this therapy for now. It is unclear to me whether this pain is related to functional dyspepsia or or to the gastritis seen on EGD. Gastritis was mild. In the past he tried omeprazole and Nexium, but he is uncertain if this helped. We will try another PPI to see if this helps with his upper abdominal pressure. I've given him samples of Dexilant 60 mg daily, and he will take this 30 minutes to one hour before his first meal. I have recommended he discontinue the Carafate. He will continue the MiraLAX. I will see him back in 6 weeks' time. If symptoms are no better, or certainly if they are worse, I will recommend cross-sectional imaging. Of note, his symptoms are worse with movement, which suggests a more musculoskeletal etiology and less of a GI problem. He is happy with this plan.

## 2011-12-15 NOTE — Patient Instructions (Signed)
Continue taking Miralax and your probiotic. Dr. Rhea Belton sent in a prescription for Dexilant for you to pick up at your pharmacy. Take 1 capsule daily 30 minutes before your first meal of the day.  Stop taking carafate  Dr. Rhea Belton would like to see you back in the office for a follow up in 6 weeks.

## 2011-12-15 NOTE — ED Provider Notes (Signed)
History     CSN: [id number]  Arrival date & time 12/10/11  2003   First MD Initiated Contact with Patient 12/10/11 2332      Chief Complaint  Patient presents with  . Abdominal Pain    (Consider location/radiation/quality/duration/timing/severity/associated sxs/prior treatment) Patient is a 21 y.o. male presenting with abdominal pain. The history is provided by the patient. No language interpreter was used.  Abdominal Pain The primary symptoms of the illness include abdominal pain, nausea and vomiting. The current episode started more than 2 days ago. The onset of the illness was gradual. The problem has not changed since onset. The pain came on gradually. The abdominal pain has been unchanged since its onset. The abdominal pain is located in the epigastric region. The abdominal pain does not radiate. The severity of the abdominal pain is 6/10. The abdominal pain is relieved by nothing. Exacerbated by: nothing.  The illness is associated with eating. The patient has not had a change in bowel habit. Risk factors: Had endoscopy for same 1 year ago and initially better now worse again. Symptoms associated with the illness do not include back pain. Significant associated medical issues include GERD.    Past Medical History  Diagnosis Date  . GERD (gastroesophageal reflux disease)     History reviewed. No pertinent past surgical history.  Family History  Problem Relation Age of Onset  . Heart disease Paternal Grandmother     History  Substance Use Topics  . Smoking status: Never Smoker   . Smokeless tobacco: Never Used  . Alcohol Use: No      Review of Systems  Constitutional: Negative.   HENT: Negative.   Respiratory: Negative.   Cardiovascular: Negative.   Gastrointestinal: Positive for nausea, vomiting and abdominal pain.  Genitourinary: Negative.   Musculoskeletal: Negative for back pain.  Neurological: Negative.   Hematological: Negative.   Psychiatric/Behavioral:  Negative.     Allergies  Naproxen and Prilosec  Home Medications   Current Outpatient Rx  Name Route Sig Dispense Refill  . PROBIOTIC ACIDOPHILUS PO Oral Take 1 tablet by mouth daily.    . SUCRALFATE 1 GM/10ML PO SUSP Oral Take 10 mLs (1 g total) by mouth 4 (four) times daily. 420 mL 0    BP 116/66  Pulse 58  Temp(Src) 98.3 F (36.8 C) (Oral)  Resp 16  Ht 5\' 10"  (1.778 m)  Wt 136 lb (61.689 kg)  BMI 19.51 kg/m2  SpO2 96%  Physical Exam  Constitutional: He is oriented to person, place, and time. He appears well-developed and well-nourished. No distress.  HENT:  Head: Normocephalic and atraumatic.  Mouth/Throat: Oropharynx is clear and moist.  Eyes: Conjunctivae are normal. Pupils are equal, round, and reactive to light.  Neck: Normal range of motion. Neck supple.  Cardiovascular: Normal rate and regular rhythm.   Pulmonary/Chest: Effort normal and breath sounds normal.  Abdominal: Soft. Bowel sounds are normal. There is no tenderness. There is no rebound and no guarding.  Musculoskeletal: Normal range of motion.  Neurological: He is alert and oriented to person, place, and time.  Skin: Skin is warm and dry.  Psychiatric: He has a normal mood and affect.    ED Course  Procedures (including critical care time)   Labs Reviewed  CBC  DIFFERENTIAL  LIPASE, BLOOD  HEPATIC FUNCTION PANEL  POCT I-STAT, CHEM 8  LAB REPORT - SCANNED   No results found.   1. Gastritis   2. Constipation  MDM  Follow up with your Gi specialist.  Patient and wife verbalize understanding and agree to follow up       Alyah Boehning Smitty Cords, MD 12/15/11 0981

## 2011-12-16 ENCOUNTER — Telehealth: Payer: Self-pay | Admitting: Gastroenterology

## 2011-12-16 NOTE — Telephone Encounter (Signed)
Pt called in and asked if he can break up Dexilant and put it in applesauce; because he has a hard time swallowing pills. Per Dr. Rhea Belton, he cannot, he can do that if he would like to switch back to nexium. Pt. Stated that he has Nexium left over and he will go back on those.

## 2011-12-19 ENCOUNTER — Telehealth: Payer: Self-pay | Admitting: Internal Medicine

## 2011-12-19 ENCOUNTER — Telehealth: Payer: Self-pay | Admitting: Family Medicine

## 2011-12-19 ENCOUNTER — Ambulatory Visit: Payer: 59 | Admitting: Physical Therapy

## 2011-12-19 MED ORDER — HYDROCORTISONE ACE-PRAMOXINE 1-1 % RE FOAM: 1.0000 | Freq: Two times a day (BID) | RECTAL | Status: DC

## 2011-12-19 NOTE — Telephone Encounter (Signed)
Phone call :He has had difficulty with some bright red rectal bleeding as well as constipation. Presently he is using an over-the-counter ointment and getting some relief. If continued difficulty later this week I will need to see him.

## 2011-12-19 NOTE — Telephone Encounter (Signed)
Trial of proctofoam PR If this continues it likely needs further eval. Pt does have constipation.  Is bleeding with passing stools only?  Is this painful?

## 2011-12-19 NOTE — Telephone Encounter (Signed)
Pt reports he has rectal bleeding with and without stools. The area hurts all the time and is no worse when he has a BM. Informed him to try the proctofoam and call if no better or for further problems or questions.

## 2011-12-19 NOTE — Telephone Encounter (Signed)
OV 12/15/11 , had  negative U/S and 2 View of abdomen showing colonic stool. He is on Dexilant and Miralax. Pt reports rectal bleeding with associated pain and itching x 3 days. He denies constipation and straining for BMs and bought an OTC med which he tried today. Pt thinks it may be hemorrhoids; can we order something? Thanks.

## 2011-12-21 ENCOUNTER — Ambulatory Visit: Payer: 59 | Admitting: Physical Therapy

## 2011-12-22 ENCOUNTER — Telehealth: Payer: Self-pay

## 2011-12-22 NOTE — Telephone Encounter (Signed)
Becky from [facility] on [address] said she is working with Tammy Sours neck pain light exercise is causeing him ringing in ears and distorted sounds in his hearing has signs of instability she would like to speak with you [phone]

## 2011-12-27 ENCOUNTER — Ambulatory Visit: Payer: 59 | Admitting: Physical Therapy

## 2011-12-29 ENCOUNTER — Encounter: Payer: 59 | Admitting: Physical Therapy

## 2011-12-30 ENCOUNTER — Ambulatory Visit (INDEPENDENT_AMBULATORY_CARE_PROVIDER_SITE_OTHER): Payer: 59 | Admitting: Family Medicine

## 2011-12-30 ENCOUNTER — Encounter: Payer: Self-pay | Admitting: Family Medicine

## 2011-12-30 VITALS — BP 110/70 | HR 81 | Wt 133.0 lb

## 2011-12-30 DIAGNOSIS — M542 Cervicalgia: Secondary | ICD-10-CM

## 2011-12-30 DIAGNOSIS — R109 Unspecified abdominal pain: Secondary | ICD-10-CM

## 2011-12-30 NOTE — Progress Notes (Signed)
  Subjective:    Patient ID: Richard Trujillo, male    DOB: 13-Feb-1991, 21 y.o.   MRN: [id number]  HPI He is here for recheck. He has continued in physical therapy. They're working with him diligently unfortunately he is making no progress with this neck pain. He also feels pain into his upper back and occasionally into his arms but in no particular nerve root pattern. He is doing better with his stomach symptoms stating that Dexilant seems to be helping with his abdominal and chest symptoms.   Review of Systems     Objective:   Physical Exam Alert and in no distress. No tenderness palpation of his neck or upper back musculature. Normal motor, sensory and DTRs. Strapping and taping is in place.       Assessment & Plan:   1. Neck pain on left side   2. Abdominal pain    I discussed options with him. I will refer him to a chiropractor to take a slightly different approach to his neck pain.

## 2012-01-02 ENCOUNTER — Telehealth: Payer: Self-pay | Admitting: Family Medicine

## 2012-01-02 NOTE — Telephone Encounter (Signed)
Pt girlfriend called and states patient got worse over the weekend.  He had to work the weekend and had to leave because of the pain.  They do not want to go to chiro, but would like a referral to a Neurologist for today if possible.  Tingling now in left temple, down arm, down leg.  Can't take pain meds make him nauseated.  Any other rx to [facility] out pt pharm.  Please advise pt of what next.

## 2012-01-02 NOTE — Telephone Encounter (Signed)
Get him a neurology appointment with the neurologist with [facility] or or over in [facility]

## 2012-01-02 NOTE — Telephone Encounter (Signed)
Pt has appt [facility]. May 7 at 2;15 pt informed

## 2012-01-03 ENCOUNTER — Ambulatory Visit (HOSPITAL_COMMUNITY)
Admission: RE | Admit: 2012-01-03 | Discharge: 2012-01-03 | Disposition: A | Discharge status: Home / Self Care | Payer: 59 | Source: Ambulatory Visit | Attending: Neurology | Admitting: Neurology

## 2012-01-03 ENCOUNTER — Other Ambulatory Visit (HOSPITAL_COMMUNITY): Payer: Self-pay | Admitting: Neurology

## 2012-01-03 ENCOUNTER — Encounter: Payer: 59 | Admitting: Physical Therapy

## 2012-01-03 DIAGNOSIS — M542 Cervicalgia: Secondary | ICD-10-CM | POA: Insufficient documentation

## 2012-01-03 DIAGNOSIS — R209 Unspecified disturbances of skin sensation: Secondary | ICD-10-CM | POA: Insufficient documentation

## 2012-01-03 DIAGNOSIS — R200 Anesthesia of skin: Secondary | ICD-10-CM

## 2012-01-03 DIAGNOSIS — M79609 Pain in unspecified limb: Secondary | ICD-10-CM | POA: Insufficient documentation

## 2012-01-05 ENCOUNTER — Ambulatory Visit: Payer: 59 | Attending: Family Medicine | Admitting: Physical Therapy

## 2012-01-05 DIAGNOSIS — M2569 Stiffness of other specified joint, not elsewhere classified: Secondary | ICD-10-CM | POA: Insufficient documentation

## 2012-01-05 DIAGNOSIS — IMO0001 Reserved for inherently not codable concepts without codable children: Secondary | ICD-10-CM | POA: Insufficient documentation

## 2012-01-05 DIAGNOSIS — M542 Cervicalgia: Secondary | ICD-10-CM | POA: Insufficient documentation

## 2012-01-05 DIAGNOSIS — R293 Abnormal posture: Secondary | ICD-10-CM | POA: Insufficient documentation

## 2012-01-10 ENCOUNTER — Telehealth: Payer: Self-pay | Admitting: Internal Medicine

## 2012-01-10 NOTE — Telephone Encounter (Signed)
Pt was given Dexilant on 12/15/11 andit has helped with his problems. Pt still has days where his " stomach and esophageal are are upset", but the Dexilant has worked better than anything so far.  Pt wants to know if we can refill the Dexilant and does he need a f/u appt? Thanks.

## 2012-01-11 MED ORDER — DEXLANSOPRAZOLE 60 MG PO CPDR: 60.0000 mg | DELAYED_RELEASE_CAPSULE | Freq: Every day | ORAL | Status: DC

## 2012-01-11 NOTE — Telephone Encounter (Signed)
Okay to refill dexilant 60 mg daily with 5 refills., return OV in 6 months, soon if needed.

## 2012-01-11 NOTE — Telephone Encounter (Signed)
Informed pt of refill on Dexilant; I will call to schedule him for a f/u in 6 months. Pt stated understanding.

## 2012-01-17 ENCOUNTER — Ambulatory Visit: Payer: 59 | Admitting: Physical Therapy

## 2012-01-19 ENCOUNTER — Ambulatory Visit: Payer: 59 | Admitting: Physical Therapy

## 2012-01-20 DIAGNOSIS — Z9289 Personal history of other medical treatment: Secondary | ICD-10-CM

## 2012-01-20 HISTORY — DX: Personal history of other medical treatment: Z92.89

## 2012-01-24 ENCOUNTER — Ambulatory Visit: Payer: 59 | Admitting: Physical Therapy

## 2012-01-26 ENCOUNTER — Encounter: Payer: 59 | Admitting: Physical Therapy

## 2012-01-31 ENCOUNTER — Encounter: Payer: 59 | Admitting: Physical Therapy

## 2012-02-01 ENCOUNTER — Encounter: Payer: Self-pay | Admitting: Family Medicine

## 2012-02-02 ENCOUNTER — Encounter: Payer: 59 | Admitting: Physical Therapy

## 2012-02-08 ENCOUNTER — Ambulatory Visit (INDEPENDENT_AMBULATORY_CARE_PROVIDER_SITE_OTHER): Payer: 59 | Admitting: Medical

## 2012-02-08 ENCOUNTER — Encounter: Payer: Self-pay | Admitting: Medical

## 2012-02-08 VITALS — BP 110/64 | HR 60 | Temp 97.8°F | Resp 14 | Wt 130.0 lb

## 2012-02-08 DIAGNOSIS — R071 Chest pain on breathing: Secondary | ICD-10-CM

## 2012-02-08 DIAGNOSIS — R0789 Other chest pain: Secondary | ICD-10-CM

## 2012-02-08 DIAGNOSIS — R079 Chest pain, unspecified: Secondary | ICD-10-CM

## 2012-02-08 DIAGNOSIS — R1013 Epigastric pain: Secondary | ICD-10-CM

## 2012-02-08 MED ORDER — IBUPROFEN 600 MG PO TABS: 600.0000 mg | ORAL_TABLET | Freq: Three times a day (TID) | ORAL | Status: AC | PRN

## 2012-02-08 MED ORDER — CYCLOBENZAPRINE HCL 5 MG PO TABS: ORAL_TABLET | ORAL | Status: DC

## 2012-02-08 NOTE — Progress Notes (Signed)
Subjective: Here for c/o chest pain.    He reports that he has been in great health until this year when he all of the sudden has had new things occur.  He was having chest/abdominal pain, ended up seeing GI, had EGD, and was placed on Dexilant which has helped some.  He still feels like this was more of a trial as they really weren't sure if he had GERD or not.  He also hurt his neck when lifting up from bent over position several weeks ago.  He felt a rip in his neck, couldn't move the neck for a bout a week, and ended up seeing physical therapy which didn't help.  He also saw neurology and just had MRI and EMG/NCS done within the past month.   He reports chest pain that is isolated to middle low chest just above the epigastric region.  The pain can be sharp, intermittent, but nothing in particular worsens or makes it better.   Denies palpitations, SOB, DOE, skipped beats.  No family hx/o heart disease.  Using nothing for this.  No other c/o.  The following portions of the patient's history were reviewed and updated as appropriate: allergies, current medications, past family history, past medical history, past social history, past surgical history and problem list.  Past Medical History  Diagnosis Date  . GERD (gastroesophageal reflux disease)   . Neck pain     PT and neurology consults 12/2011  . History of EMG 01/20/12    EMG/NCS    Allergies  Allergen Reactions  . Naproxen Nausea And Vomiting  . Prilosec (Omeprazole Magnesium)     Chest pains   Review of Systems ROS reviewed and was negative other than noted in HPI or above.    Objective:   Physical Exam  General appearance: alert, no distress, WD/WN Oral cavity: MMM, no lesions Neck: supple, nontender, but pain with neck ROM, no lymphadenopathy, no thyromegaly, no masses Chest wall tender along lower sternum and left chest Heart: RRR, normal S1, S2, no murmurs Lungs: CTA bilaterally, no wheezes, rhonchi, or rales Abdomen:  +bs, soft, non tender, non distended, no masses, no hepatomegaly, no splenomegaly Pulses: 2+ symmetric, upper and lower extremities, normal cap refill   Assessment and Plan :     Encounter Diagnoses  Name Primary?  . Chest pain Yes  . Epigastric pain   . Chest wall pain    I reviewed prior GI notes, EGD, MRI of C spine, EKG from the recent ED visit, and NCS.  I suspect his chest pain is really chest wall pain, possibly related to body positioning and how he has been dealing with the neck pain problem.  He has had no activity or trauma or injury to cause the pain.  Reassured that this doesn't sound cardiac or GI related.  He has seen improvement on Dexilant.   Trial of Naprelan and Flexeril.  If not improving in 2 weeks, consider CT chest or other eval.

## 2012-02-09 ENCOUNTER — Encounter: Payer: Self-pay | Admitting: Medical

## 2012-03-12 ENCOUNTER — Telehealth: Payer: Self-pay | Admitting: Internal Medicine

## 2012-03-12 ENCOUNTER — Other Ambulatory Visit: Payer: Self-pay | Admitting: Medical

## 2012-03-12 MED ORDER — CYCLOBENZAPRINE HCL 5 MG PO TABS: ORAL_TABLET | ORAL | Status: DC

## 2012-03-12 NOTE — Telephone Encounter (Signed)
rx sent

## 2012-04-20 ENCOUNTER — Telehealth: Payer: Self-pay | Admitting: Internal Medicine

## 2012-04-20 MED ORDER — CYCLOBENZAPRINE HCL 5 MG PO TABS: ORAL_TABLET | ORAL | Status: DC

## 2012-04-20 NOTE — Telephone Encounter (Signed)
I called the medication out to the patients pharmacy per Dr. Susann Givens. CLS

## 2012-04-20 NOTE — Telephone Encounter (Signed)
Call in the Flexeril

## 2012-04-20 NOTE — Telephone Encounter (Signed)
Flexeril called in.

## 2012-06-12 ENCOUNTER — Telehealth: Payer: Self-pay | Admitting: *Deleted

## 2012-06-12 NOTE — Telephone Encounter (Signed)
lmom for pt to call back

## 2012-06-12 NOTE — Telephone Encounter (Signed)
Pt scheduled for f/u on 06/20/12.

## 2012-06-12 NOTE — Telephone Encounter (Signed)
Message copied by Florene Glen on Tue Jun 12, 2012  8:25 AM ------      Message from: Graciella Freer K      Created: Wed Jan 11, 2012  2:02 PM       Needs 6 month f/u

## 2012-06-18 ENCOUNTER — Other Ambulatory Visit: Payer: 59

## 2012-06-19 ENCOUNTER — Encounter: Payer: Self-pay | Admitting: Internal Medicine

## 2012-06-20 ENCOUNTER — Ambulatory Visit: Payer: 59 | Admitting: Internal Medicine

## 2012-06-26 ENCOUNTER — Encounter: Payer: Self-pay | Admitting: Internal Medicine

## 2012-06-27 ENCOUNTER — Ambulatory Visit: Payer: 59 | Admitting: Internal Medicine

## 2012-07-09 ENCOUNTER — Encounter: Payer: Self-pay | Admitting: Internal Medicine

## 2012-07-10 ENCOUNTER — Encounter: Payer: Self-pay | Admitting: Internal Medicine

## 2012-07-10 ENCOUNTER — Ambulatory Visit (INDEPENDENT_AMBULATORY_CARE_PROVIDER_SITE_OTHER): Payer: 59 | Admitting: Internal Medicine

## 2012-07-10 VITALS — BP 110/80 | HR 60 | Ht 69.0 in | Wt 134.4 lb

## 2012-07-10 DIAGNOSIS — R1012 Left upper quadrant pain: Secondary | ICD-10-CM

## 2012-07-10 DIAGNOSIS — R1013 Epigastric pain: Secondary | ICD-10-CM

## 2012-07-10 DIAGNOSIS — K5900 Constipation, unspecified: Secondary | ICD-10-CM

## 2012-07-10 NOTE — Progress Notes (Signed)
  Subjective:    Patient ID: Quintin Hjort, male    DOB: [birth year], 21 y.o.   MRN: [id number]  HPI Mr. Villacis is a 21 year old male who seen in followup. He was seen in the past for evaluation of epigastric and left upper quadrant abdominal pain. He underwent upper endoscopy which revealed mild gastritis negative for H. pylori. He also had issues with constipation and was started on MiraLAX therapy. He returns today stating that he feels very very well. His epigastric and left upper quadrant abdominal pain has completely resolved. He ended up seeing a local chiropractor and was found to have cervical and thoracic spine issues which have improved with treatment and alignment.  After completing his therapy he has no further epigastric or left upper quadrant pain. No nausea or vomiting. Appetite is good. No weight loss. No heartburn. He is no longer having constipation and he is not requiring laxatives. No diarrhea. No melena or rectal bleeding.  Review of Systems As per HPI  Current Medications, Allergies, Past Medical History, Past Surgical History, Family History and Social History were reviewed in [employer] record.     Objective:   Physical Exam BP 110/80  Pulse 60  Ht 5\' 9"  (1.753 m)  Wt 134 lb 6.4 oz (60.963 kg)  BMI 19.85 kg/m2 Constitutional: Well-developed and well-nourished. No distress. HEENT: Normocephalic and atraumatic. Oropharynx is clear and moist. No oropharyngeal exudate. Conjunctivae are normal.  No scleral icterus. Cardiovascular: Normal rate, regular rhythm and intact distal pulses. No M/R/G Pulmonary/chest: Effort normal and breath sounds normal. No wheezing, rales or rhonchi. Abdominal: Soft, nontender, nondistended. Bowel sounds active throughout. There are no masses palpable. No hepatosplenomegaly. Extremities: no clubbing, cyanosis, or edema Neurological: Alert and oriented to person place and time. Skin: Skin is warm and dry. No rashes  noted. Psychiatric: Normal mood and affect. Behavior is normal.     Assessment & Plan:   21 year old male seen in followup for epigastric and left upper quadrant pain and intermittent constipation.  1.  Left upper quadrant/epigastric pain -- this has completely resolved and he is having no further issues. He is no longer on PPI and is not having heartburn. He feels that with his chiropractic treatment all of his issues have resolved.  This is good news and he can followup when necessary  2.  Constipation -- no longer an issue. Can use MiraLAX on an as-needed basis.

## 2012-07-10 NOTE — Patient Instructions (Addendum)
Follow up with Dr. Rhea Belton. As needed

## 2012-10-13 ENCOUNTER — Other Ambulatory Visit: Payer: Self-pay

## 2012-12-07 ENCOUNTER — Ambulatory Visit (INDEPENDENT_AMBULATORY_CARE_PROVIDER_SITE_OTHER): Payer: 59 | Admitting: Family Medicine

## 2012-12-07 ENCOUNTER — Encounter: Payer: Self-pay | Admitting: Family Medicine

## 2012-12-07 VITALS — BP 118/78 | HR 72 | Wt 138.0 lb

## 2012-12-07 DIAGNOSIS — Z23 Encounter for immunization: Secondary | ICD-10-CM

## 2012-12-07 DIAGNOSIS — L503 Dermatographic urticaria: Secondary | ICD-10-CM

## 2012-12-07 NOTE — Progress Notes (Signed)
  Subjective:    Patient ID: Richard Trujillo, male    DOB: 1990/09/25, 22 y.o.   MRN: [id number]  HPI He continues to have difficulty with tingling and burning sensation to the lateral calf area. He has been using antifungal medications but states he is not really truly seen a rash and less she scratches the area. He has no lesions elsewhere he complains of. Review of record indicates he needs his tetanus updated  Review of Systems     Objective:   Physical Exam Exam of his lower extremities shows no swelling discoloration or other lesions. Irritation to the scan did cause dermatoographia        Assessment & Plan:  Dermatographia recommend Benadryl at night and Claritin during the day. Also recommend cold compresses when he has a sensation of itching or discomfort.  TDaP given

## 2012-12-07 NOTE — Patient Instructions (Signed)
Use cold compresses on your leg and Benadryl at night Claritin during the day

## 2013-07-04 ENCOUNTER — Other Ambulatory Visit: Payer: Self-pay

## 2014-03-05 ENCOUNTER — Encounter: Payer: Self-pay | Admitting: Medical

## 2014-03-05 ENCOUNTER — Ambulatory Visit (INDEPENDENT_AMBULATORY_CARE_PROVIDER_SITE_OTHER): Payer: 59 | Admitting: Medical

## 2014-03-05 VITALS — BP 112/80 | HR 82 | Temp 98.1°F | Resp 14 | Wt 158.0 lb

## 2014-03-05 DIAGNOSIS — T148 Other injury of unspecified body region: Secondary | ICD-10-CM

## 2014-03-05 DIAGNOSIS — R21 Rash and other nonspecific skin eruption: Secondary | ICD-10-CM

## 2014-03-05 DIAGNOSIS — W57XXXA Bitten or stung by nonvenomous insect and other nonvenomous arthropods, initial encounter: Secondary | ICD-10-CM

## 2014-03-05 MED ORDER — TRIAMCINOLONE ACETONIDE 0.1 % EX CREA: 1.0000 "application " | TOPICAL_CREAM | Freq: Two times a day (BID) | CUTANEOUS | Status: DC

## 2014-03-05 NOTE — Patient Instructions (Signed)
  Thank you for giving me the opportunity to serve you today.    Your diagnosis today includes: Encounter Diagnoses  Name Primary?  . Insect bite Yes  . Rash and nonspecific skin eruption      Specific recommendations today include:  Your skin lesions are suggestive of Chigger bites  Begin Benadryl by mouth 1/2-1 tablet up to every 6 hours for itching insect bite reaction  Begin topical triamcinolone cream to the rashes   Use ice pack to the areas  If any worsening of symptoms such as fever, warmth, swelling, then let me know  Return if symptoms worsen or fail to improve.

## 2014-03-05 NOTE — Progress Notes (Signed)
   Subjective:    Patient ID: Richard Trujillo, male    DOB: [birth year], 23 y.o.   MRN: [id number]  HPI  Presenting today for 6 suspected bug bites over his thighs since Saturday. Patient reports that [place] farm [place] cookout on a big open field. He didn't notice anything until Monday when he noticed itching. This progressed to swelling at 6 different spots, worse itching and redness the next day. The itching is made worse with touch, walking and movement in general. He denies bleeding, drainage, discharge, fevers, n/v, numbness, tingling, abdominal pain, chest pain, sob, difficulty breathing, confusion, dizziness. He does report that he came into contact with a friend who had previously been treated for a rash believed to be caused from bug bites. That friend was prescribed permethrin cream which the patient started using on Monday without any relief. Normally, when the patient has bug bites, they appear red and usually resolve after a day. He also states that he used bug spray and that the fields were also treated for mosquitoes. He cannot recall seeing any spiders, bees, ticks anywhere.  The patient has no other questions or concerns.  Review of Systems As in subjective     Objective:   Physical Exam   General appearence: alert, no distress, WD/WN,  Skin: There are 6 individual skin lesions, one on the lower left medial leg, 3 on the right upper thigh, and the remaining on the right lower leg all with approximately 1-2 cm area of erythema, slight swelling, seems to be a central bite mark, otherwise no induration, no fluctuance, no warmth-all suggestive of chigger bites      Assessment & Plan:   Encounter Diagnoses  Name Primary?  . Insect bite Yes  . Rash and nonspecific skin eruption    Skin lesion suggestive of chigger bite, insect bite.  Begin Benadryl oral, triamcinolone topical, ice pack, and lesions should gradually resolve.  Call or return if any sign of  infection is discussed

## 2014-09-11 ENCOUNTER — Ambulatory Visit (INDEPENDENT_AMBULATORY_CARE_PROVIDER_SITE_OTHER): Payer: 59 | Admitting: Family Medicine

## 2014-09-11 ENCOUNTER — Encounter: Payer: Self-pay | Admitting: Family Medicine

## 2014-09-11 VITALS — BP 120/82 | HR 62 | Wt 158.0 lb

## 2014-09-11 DIAGNOSIS — Z0000 Encounter for general adult medical examination without abnormal findings: Secondary | ICD-10-CM

## 2014-09-11 DIAGNOSIS — M542 Cervicalgia: Secondary | ICD-10-CM

## 2014-09-11 DIAGNOSIS — K219 Gastro-esophageal reflux disease without esophagitis: Secondary | ICD-10-CM

## 2014-09-11 NOTE — Progress Notes (Signed)
   Subjective:    Patient ID: Richard Trujillo, male    DOB: 21-Oct-1990, 24 y.o.   MRN: [id number]  HPI He is here for complete examination. 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 He is planning on getting married in April. He has had difficulty with neck pain and has gotten the most response from a chiropractor to help with his neck and shoulder discomfort. He is checked every 2 weeks and is quite comfortable with this. He is had very little difficulty with reflux. Family and social history was reviewed. His immunizations were also reviewed.   Review of Systems  All other systems reviewed and are negative.      Objective:   Physical Exam BP 120/82 mmHg  Pulse 62  Wt 158 lb (71.668 kg)  SpO2 97%  General Appearance:    Alert, cooperative, no distress, appears stated age  Head:    Normocephalic, without obvious abnormality, atraumatic  Eyes:    PERRL, conjunctiva/corneas clear, EOM's intact, fundi    benign  Ears:    Normal TM's and external ear canals  Nose:   Nares normal, mucosa normal, no drainage or sinus   tenderness  Throat:   Lips, mucosa, and tongue normal; teeth and gums normal  Neck:   Supple, no lymphadenopathy;  thyroid:  no   enlargement/tenderness/nodules; no carotid   bruit or JVD  Back:    Spine nontender, no curvature, ROM normal, no CVA     tenderness  Lungs:     Clear to auscultation bilaterally without wheezes, rales or     ronchi; respirations unlabored  Chest Wall:    No tenderness or deformity   Heart:    Regular rate and rhythm, S1 and S2 normal, no murmur, rub   or gallop  Breast Exam:    No chest wall tenderness, masses or gynecomastia  Abdomen:     Soft, non-tender, nondistended, normoactive bowel sounds,    no masses, no hepatosplenomegaly  Genitalia:    Normal male external genitalia without lesions.  Testicles without masses.  No inguinal hernias.  Rectal:   Deferred due to age <40 and lack of symptoms  Extremities:   No clubbing, cyanosis or  edema  Pulses:   2+ and symmetric all extremities  Skin:   Skin color, texture, turgor normal, no rashes or lesions  Lymph nodes:   Cervical, supraclavicular, and axillary nodes normal  Neurologic:   CNII-XII intact, normal strength, sensation and gait; reflexes 2+ and symmetric throughout          Psych:   Normal mood, affect, hygiene and grooming.         Assessment & Plan:  Routine general medical examination at a health care facility  Neck pain  Gastroesophageal reflux disease without esophagitis I  encouraged him to continue to take good care of himself. Congratulated him on his impending marriage.

## 2015-05-20 ENCOUNTER — Encounter: Payer: Self-pay | Admitting: Family Medicine

## 2015-05-20 ENCOUNTER — Ambulatory Visit (INDEPENDENT_AMBULATORY_CARE_PROVIDER_SITE_OTHER): Payer: 59 | Admitting: Family Medicine

## 2015-05-20 VITALS — BP 112/82 | HR 68 | Temp 97.5°F | Resp 12 | Ht 69.0 in | Wt 163.0 lb

## 2015-05-20 DIAGNOSIS — J329 Chronic sinusitis, unspecified: Secondary | ICD-10-CM

## 2015-05-20 DIAGNOSIS — J40 Bronchitis, not specified as acute or chronic: Secondary | ICD-10-CM

## 2015-05-20 DIAGNOSIS — R05 Cough: Secondary | ICD-10-CM | POA: Diagnosis not present

## 2015-05-20 DIAGNOSIS — R059 Cough, unspecified: Secondary | ICD-10-CM

## 2015-05-20 MED ORDER — AZITHROMYCIN 200 MG/5ML PO SUSR: ORAL | Status: DC

## 2015-05-20 MED ORDER — BENZONATATE 200 MG PO CAPS: 200.0000 mg | ORAL_CAPSULE | Freq: Three times a day (TID) | ORAL | Status: DC | PRN

## 2015-05-20 NOTE — Patient Instructions (Signed)
  Drink plenty of fluids. Take the azithromycin as directed. Call at the end if you are clearly getting worse rather than better (ie fevers, ongoing discolored mucus). Otherwise, call on day #10 (with day #1 being today, when the antibiotics are started) if you aren't 100% better, in order to refill the medication.  You should be at least partly better by day 5. Take mucinex and sudafed to help with the sinus congestion and drainage.  Use the delsym as needed.  If it isn't effective enough, then try the benzonatate to help with the cough. Call next week (Monday) if your cough isn't at all better, and we can add a short course of steroids.

## 2015-05-20 NOTE — Progress Notes (Signed)
Chief Complaint  Patient presents with  . sinus issues    started about 4 weeks ago. Got a little better but hasn't gone away. Tried Claritin but didn't help much.   A month ago he had typical sinus symptoms. Runny nose, sore throat, then sinus pressure.  He had low grade fevers (just for 1 day, gone after taking meds to reduce it, never came back).  Mucus was green at first, then changed to yellow and clear, and now it is back to green this past week.  Cough never really resolved from the first illness, but has gotten worse in the last week.  Cough is nonproductive.  Denies any shortness of breath or pain with breathing.  When he was young he had a bacterial lung infection.  He has trouble swallowing pills, didn't take the antibiotics like he was supposed to (only took a few days of meds).  He had a cough that lasted 3 months. He now typically has a cough for 3-4 weeks after any sinus problems,cold.  Delsym has been somewhat helpful.  Cough hasn't been bad at night. Over the past 2 days he has taken some claritin, without any change. He hasn't had a problem with allergies in the past.  Wife is pregnant. No sick contacts He plans to get flu shot on Friday at work.  Past Medical History  Diagnosis Date  . GERD (gastroesophageal reflux disease)   . Neck pain     PT and neurology consults 12/2011  . History of EMG 01/20/12    EMG/NCS   He has been going to the chiropractor--no longer having any neck pain, and also not having any further chest pain or reflux.  Sees Dr. Tessa Lerner.  Past Surgical History  Procedure Laterality Date  . Esophagogastroduodenoscopy  10/24/11    Dr. Hilarie Fredrickson    No outpatient encounter prescriptions on file as of 05/20/2015.   No facility-administered encounter medications on file as of 05/20/2015.   Allergies  Allergen Reactions  . Naproxen Nausea And Vomiting  . Prilosec [Omeprazole Magnesium]     Chest pains   Social History   Social History  . Marital  Status: Single    Spouse Name: N/A  . Number of Children: 0  . Years of Education: N/A   Occupational History  . [employer] [place]   Social History Main Topics  . Smoking status: Never Smoker   . Smokeless tobacco: Never Used  . Alcohol Use: No  . Drug Use: No  . Sexual Activity: Yes    Birth Control/ Protection:    Other Topics Concern  . Not on file   Social History Narrative   Married, wife is pregnant (04/2015 visit)   No current outpatient prescriptions on file prior to visit.   No current facility-administered medications on file prior to visit.   Allergies  Allergen Reactions  . Naproxen Nausea And Vomiting  . Prilosec [Omeprazole Magnesium]     Chest pains   ROS: no ear pain, current sore throat, shortness of breath, nausea, vomiting, diarrhea, bleeding, bruising, rash, heartburn, chest pain or other complaints except as per HPI.  PHYSICAL EXAM: BP 112/82 mmHg  Pulse 68  Temp(Src) 97.5 F (36.4 C) (Tympanic)  Resp 12  Ht 5\' 9"  (1.753 m)  Wt 163 lb (73.936 kg)  BMI 24.06 kg/m2  Well developed, pleasant male, with frequent dry, sometimes slightly barky cough. Speaking easily in full sentences, in no distress.  HEENT: PERRL, EOMI, conjunctiva clear.  TM's  and EAC's normal. Nasal mucosa is mildly edematous with clear mucus, no erythema. Sinuses are nontender. OP is notable for enlarged tonsils bilaterally, no erythema, but small white area in a pocket on the left. Moist mucus membranes Neck: no significant lymphadenopathy Heart: regular rate and rhythm without murmur Lungs: clear bilaterally. No wheezes, rales or ronchi. No worsening of cough with deep inspiration, forced expiration or talking. Skin:  no rash Neuro: alert and oriented, cranial nerves intact; normal strength, gait Psych: normal mood, affect, hygiene and grooming  ASSESSMENT/PLAN:  Cough - Plan: benzonatate (TESSALON) 200 MG capsule  Sinobronchitis - Plan: azithromycin (ZITHROMAX) 200 MG/5ML  suspension   Drink plenty of fluids. Take the azithromycin as directed. (prescribed liquid due to trouble swallowing pills) Call at the end if you are clearly getting worse rather than better (ie fevers, ongoing discolored mucus). Otherwise, call on day #10 (with day #1 being today, when the antibiotics are started) if you aren't 100% better, in order to refill the medication.  You should be at least partly better by day 5. Take mucinex and sudafed to help with the sinus congestion and drainage.  Use the delsym as needed.  If it isn't effective enough, then try the benzonatate to help with the cough.  (he thinks he will be able to swallow these). Call next week (Monday) if your cough isn't at all better, and we can add a short course of steroids.

## 2016-08-24 ENCOUNTER — Ambulatory Visit (INDEPENDENT_AMBULATORY_CARE_PROVIDER_SITE_OTHER): Payer: 59 | Admitting: Family Medicine

## 2016-08-24 ENCOUNTER — Encounter: Payer: Self-pay | Admitting: Family Medicine

## 2016-08-24 VITALS — BP 110/70 | HR 58 | Temp 97.7°F | Wt 169.8 lb

## 2016-08-24 DIAGNOSIS — J019 Acute sinusitis, unspecified: Secondary | ICD-10-CM | POA: Diagnosis not present

## 2016-08-24 MED ORDER — AMOXICILLIN 400 MG/5ML PO SUSR: ORAL | 0 refills | Status: DC

## 2016-08-24 MED FILL — AMOXICILLIN 400 MG/5 ML SUS: 400 | 10 days supply | Qty: 300 | Fill #0

## 2016-08-24 NOTE — Progress Notes (Signed)
Subjective: Chief Complaint  Patient presents with  . sinus    sinus for 5 weeks ago, ear ache left side     Richard Trujillo is a 25 y.o. male who presents for possible sinus infection.  Symptoms include a 5 week history of rhinorrhea, nasal congestion, left ear ache, post nasal drainage, dry cough. Denies fever, chills, body aches, sore throat, chest pain, palpitations, wheezing, abdominal pain, GI or GU symptoms. States he is not that bothered by his symptoms but he Is concerned that he keeps getting his young child sick.  History of recurrent sinus infection in distant past.  Last one was in 2015.  He is immunocompetent. Denies history of allergies.   Past history is significant for no history of pneumonia or bronchitis. Patient is a non-smoker.  Using [employer], airborne, [employer], advertising for symptoms. States he cannot swallow pills.  Probably sick contacts.  No other aggravating or relieving factors.  No other c/o.  ROS as in subjective   Objective: Vitals:   08/24/16 0811  BP: 110/70  Pulse: (!) 58  Temp: 97.7 F (36.5 C)    General appearance: Alert, WD/WN, no distress                             Skin: warm, no rash                           Head: no sinus tenderness                            Eyes: conjunctiva normal, corneas clear, PERRLA                            Ears: pearly TMs, left TM with small amount of fluid, external ear canals normal                          Nose: septum midline, turbinates swollen r>L, with erythema and clear discharge             Mouth/throat: MMM, tongue normal, mild pharyngeal erythema                           Neck: supple, no adenopathy, no thyromegaly, nontender                          Heart: RRR, normal S1, S2, no murmurs                         Lungs: CTA bilaterally, no wheezes, rales, or rhonchi       Assessment and Plan: Acute sinusitis with symptoms greater than 10 days  Prescription given for Amoxicillin liquid due to difficulty  swallowing pills.Try a nasal spray such as Flonase or Rhinocort.  Can use OTC Mucinex, Delsym for congestion.  Tylenol or Ibuprofen OTC for fever and malaise.  Discussed symptomatic relief, nasal saline flush, stay well hydrated and call or return if worse or not back to baseline after completing the antibiotic.

## 2016-08-24 NOTE — Patient Instructions (Signed)

## 2016-10-02 ENCOUNTER — Telehealth: Payer: 59 | Admitting: Family

## 2016-10-02 DIAGNOSIS — R6889 Other general symptoms and signs: Secondary | ICD-10-CM

## 2016-10-02 MED ORDER — OSELTAMIVIR PHOSPHATE 6 MG/ML PO SUSR: 75.0000 mg | Freq: Two times a day (BID) | ORAL | 0 refills | Status: DC

## 2016-10-02 NOTE — Progress Notes (Signed)
E visit for Flu like symptoms   We are sorry that you are not feeling well.  Here is how we plan to help! Based on what you have shared with me it looks like you may have a respiratory virus that may be influenza.  Influenza or "the flu" is   an infection caused by a respiratory virus. The flu virus is highly contagious and persons who did not receive their yearly flu vaccination may "catch" the flu from close contact.  We have anti-viral medications to treat the viruses that cause this infection. They are not a "cure" and only shorten the course of the infection. These prescriptions are most effective when they are given within the first 2 days of "flu" symptoms. Antiviral medication are indicated if you have a high risk of complications from the flu. You should  also consider an antiviral medication if you are in close contact with someone who is at risk. These medications can help patients avoid complications from the flu  but have side effects that you should know. Possible side effects from Tamiflu or oseltamivir include nausea, vomiting, diarrhea, dizziness, headaches, eye redness, sleep problems or other respiratory symptoms. You should not take Tamiflu if you have an allergy to oseltamivir or any to the ingredients in Tamiflu.  Based upon your symptoms and potential risk factors I have prescribed Oseltamivir (Tamiflu).  It has been sent to your designated pharmacy.  You will take 75 mg  orally twice a day for the next 5 days.  ANYONE WHO HAS FLU SYMPTOMS SHOULD: . Stay home. The flu is highly contagious and going out or to work exposes others! . Be sure to drink plenty of fluids. Water is fine as well as fruit juices, sodas and electrolyte beverages. You may want to stay away from caffeine or alcohol. If you are nauseated, try taking small sips of liquids. How do you know if you are getting enough fluid? Your urine should be a pale yellow or almost colorless. . Get rest. . Taking a steamy  shower or using a humidifier may help nasal congestion and ease sore throat pain. Using a saline nasal spray works much the same way. . Cough drops, hard candies and sore throat lozenges may ease your cough. . Line up a caregiver. Have someone check on you regularly.   GET HELP RIGHT AWAY IF: . You cannot keep down liquids or your medications. . You become short of breath . Your fell like you are going to pass out or loose consciousness. . Your symptoms persist after you have completed your treatment plan MAKE SURE YOU   Understand these instructions.  Will watch your condition.  Will get help right away if you are not doing well or get worse.  Your e-visit answers were reviewed by a board certified advanced clinical practitioner to complete your personal care plan.  Depending on the condition, your plan could have included both over the counter or prescription medications.  If there is a problem please reply  once you have received a response from your provider.  Your safety is important to [place].  If you have drug allergies check your prescription carefully.    You can use MyChart to ask questions about today's visit, request a non-urgent call back, or ask for a work or school excuse for 24 hours related to this e-Visit. If it has been greater than 24 hours you will need to follow up with your provider, or enter a new e-Visit to  address those concerns.  You will get an e-mail in the next two days asking about your experience.  I hope that your e-visit has been valuable and will speed your recovery. Thank you for using e-visits.

## 2016-10-03 ENCOUNTER — Ambulatory Visit: Payer: Self-pay | Admitting: Family

## 2016-10-03 ENCOUNTER — Telehealth: Payer: Self-pay | Admitting: Family Medicine

## 2016-10-03 ENCOUNTER — Encounter: Payer: Self-pay | Admitting: Family

## 2016-10-03 MED ORDER — ACETAMINOPHEN 160 MG/5ML PO SUSP: 640.0000 mg | ORAL | 0 refills | Status: DC | PRN

## 2016-10-03 MED ORDER — BENZONATATE 100 MG PO CAPS: 100.0000 mg | ORAL_CAPSULE | Freq: Three times a day (TID) | ORAL | 0 refills | Status: DC | PRN

## 2016-10-03 MED FILL — BENZONATATE 100 MG CAP: 100 | 5 days supply | Qty: 30 | Fill #0

## 2016-10-03 NOTE — Telephone Encounter (Signed)
Pt called and done a e-visit Sunday he was having flu like symptoms he has been running a fever since sat, it is running 102.9 they gave him tamiflu sat he started taking it Sunday and has been ibuprofen to try and help with the fever but its not breaking it . It is wanting to know if he should come in  Or is there something else he can take, he has congestion and coughing, pt uses [facility] [place] [facility] and pt can be reached at [phone] please advise

## 2016-10-03 NOTE — Addendum Note (Signed)
Addended by: Benjamine Mola on: 10/03/2016 05:26 PM   Modules accepted: Orders

## 2016-10-03 NOTE — Progress Notes (Signed)
Richard Trujillo,  As this is a continuation (sub-24hr) of your E-visit, I will make the necessary changes to your treatment plan which includes liquid Tylenol and Tessalon Perles per our discussion. You may take the Tessalon Perles 100mg , 1-2 capsules every 8h as needed for cough. The Tylenol as directed on the bottle not to exceed 4000mg  in 24h. Please alternate the ibuprofen/tylenol to work with your fever if it does not come down with Tylenol alone.  Best,  Guadelupe Sabin, DNP, FNP-BC

## 2016-12-11 ENCOUNTER — Telehealth: Payer: 59 | Admitting: Family

## 2016-12-11 DIAGNOSIS — J029 Acute pharyngitis, unspecified: Secondary | ICD-10-CM | POA: Diagnosis not present

## 2016-12-11 MED ORDER — AMOXICILLIN 400 MG/5ML PO SUSR: ORAL | 0 refills | Status: DC

## 2016-12-11 NOTE — Progress Notes (Signed)
We are sorry that you are not feeling well.  Here is how we plan to help!  Based on what you have shared with me it looks like you have acute pharyngitis. This is an infection caused by bacteria and is treated with antibiotics. I have prescribed amoxicillin twice a day for 10 days.  You may use an oral decongestant such as Mucinex D or if you have glaucoma or high blood pressure use plain Mucinex. Saline nasal spray help and can safely be used as often as needed for congestion.  If you develop worsening sinus pain, fever or notice severe headache and vision changes, or if symptoms are not better after completion of antibiotic, please schedule an appointment with a health care provider.     Home Care:  Only take medications as instructed by your medical team.  Complete the entire course of an antibiotic.  Do not take these medications with alcohol.  A steam or ultrasonic humidifier can help congestion.  You can place a towel over your head and breathe in the steam from hot water coming from a faucet.  Avoid close contacts especially the very young and the elderly.  Cover your mouth when you cough or sneeze.  Always remember to wash your hands.  Get Help Right Away If:  You develop worsening fever or sinus pain.  You develop a severe head ache or visual changes.  Your symptoms persist after you have completed your treatment plan.  Make sure you  Understand these instructions.  Will watch your condition.  Will get help right away if you are not doing well or get worse.  Your e-visit answers were reviewed by a board certified advanced clinical practitioner to complete your personal care plan.  Depending on the condition, your plan could have included both over the counter or prescription medications.  If there is a problem please reply  once you have received a response from your provider.  Your safety is important to [place].  If you have drug allergies check your prescription  carefully.    You can use MyChart to ask questions about today's visit, request a non-urgent call back, or ask for a work or school excuse for 24 hours related to this e-Visit. If it has been greater than 24 hours you will need to follow up with your provider, or enter a new e-Visit to address those concerns.  You will get an e-mail in the next two days asking about your experience.  I hope that your e-visit has been valuable and will speed your recovery. Thank you for using e-visits.

## 2017-02-12 ENCOUNTER — Telehealth: Payer: 59 | Admitting: Physician Assistant

## 2017-02-12 DIAGNOSIS — J019 Acute sinusitis, unspecified: Secondary | ICD-10-CM

## 2017-02-12 MED ORDER — AMOXICILLIN 400 MG/5ML PO SUSR: ORAL | 0 refills | Status: DC

## 2017-02-12 NOTE — Progress Notes (Signed)
We are sorry that you are not feeling well.  Here is how we plan to help!  Based on what you have shared with me it looks like you have sinusitis.  Sinusitis is inflammation and infection in the sinus cavities of the head.  Based on your presentation I believe you most likely have Acute Bacterial Sinusitis.  This is an infection caused by bacteria and is treated with antibiotics. I have prescribed Amoxicillin suspension for you to take as directed. You may use an oral decongestant such as Mucinex D or if you have glaucoma or high blood pressure use plain Mucinex. Saline nasal spray help and can safely be used as often as needed for congestion.  If you develop worsening sinus pain, fever or notice severe headache and vision changes, or if symptoms are not better after completion of antibiotic, please schedule an appointment with a health care provider.    Sinus infections are not as easily transmitted as other respiratory infection, however we still recommend that you avoid close contact with loved ones, especially the very young and elderly.  Remember to wash your hands thoroughly throughout the day as this is the number one way to prevent the spread of infection!  Home Care:  Only take medications as instructed by your medical team.  Complete the entire course of an antibiotic.  Do not take these medications with alcohol.  A steam or ultrasonic humidifier can help congestion.  You can place a towel over your head and breathe in the steam from hot water coming from a faucet.  Avoid close contacts especially the very young and the elderly.  Cover your mouth when you cough or sneeze.  Always remember to wash your hands.  Get Help Right Away If:  You develop worsening fever or sinus pain.  You develop a severe head ache or visual changes.  Your symptoms persist after you have completed your treatment plan.  Make sure you  Understand these instructions.  Will watch your  condition.  Will get help right away if you are not doing well or get worse.  Your e-visit answers were reviewed by a board certified advanced clinical practitioner to complete your personal care plan.  Depending on the condition, your plan could have included both over the counter or prescription medications.  If there is a problem please reply  once you have received a response from your provider.  Your safety is important to [place].  If you have drug allergies check your prescription carefully.    You can use MyChart to ask questions about today's visit, request a non-urgent call back, or ask for a work or school excuse for 24 hours related to this e-Visit. If it has been greater than 24 hours you will need to follow up with your provider, or enter a new e-Visit to address those concerns.  You will get an e-mail in the next two days asking about your experience.  I hope that your e-visit has been valuable and will speed your recovery. Thank you for using e-visits.

## 2017-07-09 ENCOUNTER — Telehealth: Payer: 59 | Admitting: Family

## 2017-07-09 DIAGNOSIS — J40 Bronchitis, not specified as acute or chronic: Secondary | ICD-10-CM | POA: Diagnosis not present

## 2017-07-09 DIAGNOSIS — J329 Chronic sinusitis, unspecified: Secondary | ICD-10-CM | POA: Diagnosis not present

## 2017-07-09 MED ORDER — AZITHROMYCIN 200 MG/5ML PO SUSR: ORAL | 0 refills | Status: DC

## 2017-07-09 MED ORDER — BENZONATATE 100 MG PO CAPS: 100.0000 mg | ORAL_CAPSULE | Freq: Three times a day (TID) | ORAL | 0 refills | Status: DC | PRN

## 2017-07-09 NOTE — Progress Notes (Signed)

## 2017-09-05 ENCOUNTER — Encounter: Payer: 59 | Admitting: Family Medicine

## 2017-09-06 ENCOUNTER — Ambulatory Visit: Payer: 59 | Admitting: Family Medicine

## 2017-09-06 ENCOUNTER — Encounter: Payer: Self-pay | Admitting: Family Medicine

## 2017-09-06 VITALS — BP 132/84 | HR 72 | Ht 69.0 in | Wt 173.4 lb

## 2017-09-06 DIAGNOSIS — L989 Disorder of the skin and subcutaneous tissue, unspecified: Secondary | ICD-10-CM | POA: Diagnosis not present

## 2017-09-06 DIAGNOSIS — Z8719 Personal history of other diseases of the digestive system: Secondary | ICD-10-CM

## 2017-09-06 DIAGNOSIS — K6289 Other specified diseases of anus and rectum: Secondary | ICD-10-CM

## 2017-09-06 DIAGNOSIS — Z0000 Encounter for general adult medical examination without abnormal findings: Secondary | ICD-10-CM | POA: Diagnosis not present

## 2017-09-06 LAB — COMPREHENSIVE METABOLIC PANEL
AG Ratio: 1.6 (calc) (ref 1.0–2.5)
ALT: 27 U/L (ref 9–46)
AST: 20 U/L (ref 10–40)
Albumin: 5 g/dL (ref 3.6–5.1)
Alkaline phosphatase (APISO): 70 U/L (ref 40–115)
BUN: 15 mg/dL (ref 7–25)
CO2: 30 mmol/L (ref 20–32)
Calcium: 10.2 mg/dL (ref 8.6–10.3)
Chloride: 101 mmol/L (ref 98–110)
Creat: 0.97 mg/dL (ref 0.60–1.35)
Globulin: 3.1 g/dL (calc) (ref 1.9–3.7)
Glucose, Bld: 101 mg/dL — ABNORMAL HIGH (ref 65–99)
Potassium: 4.3 mmol/L (ref 3.5–5.3)
Sodium: 138 mmol/L (ref 135–146)
Total Bilirubin: 0.4 mg/dL (ref 0.2–1.2)
Total Protein: 8.1 g/dL (ref 6.1–8.1)

## 2017-09-06 LAB — CBC WITH DIFFERENTIAL/PLATELET
Basophils Absolute: 50 cells/uL (ref 0–200)
Basophils Relative: 0.5 %
Eosinophils Absolute: 307 cells/uL (ref 15–500)
Eosinophils Relative: 3.1 %
HCT: 43.5 % (ref 38.5–50.0)
Hemoglobin: 14.9 g/dL (ref 13.2–17.1)
Lymphs Abs: 2732 cells/uL (ref 850–3900)
MCH: 29 pg (ref 27.0–33.0)
MCHC: 34.3 g/dL (ref 32.0–36.0)
MCV: 84.8 fL (ref 80.0–100.0)
MPV: 10.4 fL (ref 7.5–12.5)
Monocytes Relative: 7.9 %
Neutro Abs: 6029 cells/uL (ref 1500–7800)
Neutrophils Relative %: 60.9 %
Platelets: 374 10*3/uL (ref 140–400)
RBC: 5.13 10*6/uL (ref 4.20–5.80)
RDW: 12.5 % (ref 11.0–15.0)
Total Lymphocyte: 27.6 %
WBC mixed population: 782 cells/uL (ref 200–950)
WBC: 9.9 10*3/uL (ref 3.8–10.8)

## 2017-09-06 LAB — LIPID PANEL
Cholesterol: 155 mg/dL (ref <200)
HDL: 35 mg/dL — ABNORMAL LOW (ref >40)
LDL Cholesterol (Calc): 87 mg/dL (calc)
Non-HDL Cholesterol (Calc): 120 mg/dL (calc) (ref <130)
Total CHOL/HDL Ratio: 4.4 (calc) (ref <5.0)
Triglycerides: 236 mg/dL — ABNORMAL HIGH (ref <150)

## 2017-09-06 NOTE — Progress Notes (Signed)
Subjective:    Patient ID: Richard Trujillo, male    DOB: 1990-11-23, 27 y.o.   MRN: [id number]  HPI He is here for complete examination.  He and his wife is now gotten involved in [employer].  Apparently his wife is lost a significant amount of weight and he has been up and down.  He has made diet and exercise changes.  He also has a previous history of hemorrhoids.  He has had intermittent difficulty with pain in the rectal area mainly on the left side and does note some swelling and occasionally blood with that.  He also has a lesion on his left chest that tends to change in size and is dark in color.  He does have a previous history of back and neck problems and is seeing a Restaurant manager, fast food.  He has apparently had a workup for this in the past which was negative and chiropractic seems to be the best approach to this.  Family and social history as well as health maintenance immunizations was reviewed.  His work and home life are going quite well.   Review of Systems  All other systems reviewed and are negative.      Objective:   Physical Exam BP 132/84 (BP Location: Right Arm, Patient Position: Sitting)   Pulse 72   Ht 5\' 9"  (1.753 m)   Wt 173 lb 6.4 oz (78.7 kg)   SpO2 98%   BMI 25.61 kg/m   General Appearance:    Alert, cooperative, no distress, appears stated age  Head:    Normocephalic, without obvious abnormality, atraumatic  Eyes:    PERRL, conjunctiva/corneas clear, EOM's intact, fundi    benign  Ears:    Normal TM's and external ear canals  Nose:   Nares normal, mucosa normal, no drainage or sinus   tenderness  Throat:   Lips, mucosa, and tongue normal; teeth and gums normal  Neck:   Supple, no lymphadenopathy;  thyroid:  no   enlargement/tenderness/nodules; no carotid   bruit or JVD     Lungs:     Clear to auscultation bilaterally without wheezes, rales or     ronchi; respirations unlabored  Chest Wall:    No tenderness or deformity.a .5 cm round pigmented lesion is  noted superior to the left nipple   Heart:    Regular rate and rhythm, S1 and S2 normal, no murmur, rub   or gallop     Abdomen:     Soft, non-tender, nondistended, normoactive bowel sounds,    no masses, no hepatosplenomegaly  Genitalia:    Normal male external genitalia without lesions.  Testicles without masses.  No inguinal hernias.  Rectal:   The anus is normal.  He does have a slight fullness that is tender to palpation at the 3 o'clock position near the anus.  Extremities:   No clubbing, cyanosis or edema  Pulses:   2+ and symmetric all extremities  Skin:   Skin color, texture, turgor normal, no rashes or lesions  Lymph nodes:   Cervical, supraclavicular, and axillary nodes normal  Neurologic:   CNII-XII intact, normal strength, sensation and gait; reflexes 2+ and symmetric throughout          Psych:   Normal mood, affect, hygiene and grooming.         Assessment & Plan:  Routine general medical examination at a health care facility - Plan: CBC with Differential/Platelet, Comprehensive metabolic panel, Lipid panel  Skin lesion of chest wall  Anal pain  History of hemorrhoids I explained that the lesion on his chest since it tends to shrink and go is probably a sebaceous cyst.  He will keep me informed and if having more difficulty, will call me. I then recommended that he return here to the next time he has the pain and drainage from the anal area as this could represent a recurring perianal abscess. I encouraged him to continue to take good care of himself in regard to his diet and exercise and work on getting to particular waist size rather than weight.

## 2018-06-11 ENCOUNTER — Ambulatory Visit: Payer: 59 | Admitting: Family Medicine

## 2018-06-11 ENCOUNTER — Encounter: Payer: Self-pay | Admitting: Family Medicine

## 2018-06-11 VITALS — BP 120/80 | HR 75 | Temp 97.9°F | Wt 178.2 lb

## 2018-06-11 DIAGNOSIS — J209 Acute bronchitis, unspecified: Secondary | ICD-10-CM

## 2018-06-11 MED ORDER — CLARITHROMYCIN 500 MG PO TABS: 500.0000 mg | ORAL_TABLET | Freq: Two times a day (BID) | ORAL | 0 refills | Status: DC

## 2018-06-11 MED ORDER — BENZONATATE 100 MG PO CAPS: 200.0000 mg | ORAL_CAPSULE | Freq: Three times a day (TID) | ORAL | 1 refills | Status: DC | PRN

## 2018-06-11 MED FILL — BENZONATATE 100 MG CAP: 100 | 9 days supply | Qty: 20 | Fill #0

## 2018-06-11 MED FILL — CLARITHROMYCIN 500 MG TAB: 500 | 10 days supply | Qty: 20 | Fill #0

## 2018-06-11 NOTE — Patient Instructions (Signed)
Call if not entirely better when you finish the antibiotic 

## 2018-06-11 NOTE — Progress Notes (Signed)
   Subjective:    Patient ID: Richard Trujillo, male    DOB: 1990-12-09, 27 y.o.   MRN: [id number]  HPI He complains of a 2-week history of cough but no fever, chills, sore throat, earache.  He does have a 62-year-old that has been sick recently.  Does have a new child coming in late November.  He does not smoke and has no underlying allergies.   Review of Systems     Objective:   Physical Exam Alert and in no distress. Tympanic membranes and canals are normal. Pharyngeal area is normal. Neck is supple without adenopathy or thyromegaly. Cardiac exam shows a regular sinus rhythm without murmurs or gallops. Lungs are clear to auscultation.        Assessment & Plan:  Acute bronchitis, unspecified organism - Plan: clarithromycin (BIAXIN) 500 MG tablet, benzonatate (TESSALON PERLES) 100 MG capsule Also recommend using NyQuil at night.  He will call if not entirely better when he finishes the antibiotic

## 2018-06-18 ENCOUNTER — Telehealth: Payer: Self-pay | Admitting: Family Medicine

## 2018-06-18 NOTE — Telephone Encounter (Signed)
Pt is scheduled to get a flu shot at work today however since he is on Biaxin with about 3 days worth left and still displaying slight bronchitis symptoms, is it ok to get the shot today?

## 2018-06-18 NOTE — Telephone Encounter (Signed)
Pt was called and advised as long as he doesn't have a fever he should be good. [place]

## 2018-07-02 ENCOUNTER — Telehealth: Payer: Self-pay | Admitting: Family Medicine

## 2018-07-02 MED ORDER — AMOXICILLIN-POT CLAVULANATE 875-125 MG PO TABS: 1.0000 | ORAL_TABLET | Freq: Two times a day (BID) | ORAL | 0 refills | Status: DC

## 2018-07-02 MED FILL — AMOX-CLAV 875-125 MG TABLET: 875-125 | 10 days supply | Qty: 20 | Fill #0

## 2018-07-02 NOTE — Telephone Encounter (Signed)
Pt has taken antibiotic and still coughing

## 2018-07-02 NOTE — Telephone Encounter (Signed)
He states that he is only 40 to 50% better.  I will switch him to Augmentin and he is to call me if not totally back to normal.

## 2018-07-18 ENCOUNTER — Ambulatory Visit: Payer: 59 | Admitting: Family Medicine

## 2018-07-18 ENCOUNTER — Ambulatory Visit
Admission: RE | Admit: 2018-07-18 | Discharge: 2018-07-18 | Disposition: A | Discharge status: Home / Self Care | Payer: 59 | Source: Ambulatory Visit | Attending: Family Medicine | Admitting: Family Medicine

## 2018-07-18 ENCOUNTER — Encounter: Payer: Self-pay | Admitting: Family Medicine

## 2018-07-18 VITALS — BP 120/82 | HR 79 | Temp 97.8°F | Resp 18 | Wt 178.8 lb

## 2018-07-18 DIAGNOSIS — J209 Acute bronchitis, unspecified: Secondary | ICD-10-CM

## 2018-07-18 MED ORDER — PREDNISONE 10 MG (48) PO TBPK: ORAL_TABLET | ORAL | 0 refills | Status: DC

## 2018-07-18 MED ORDER — LEVOFLOXACIN 500 MG PO TABS: 500.0000 mg | ORAL_TABLET | Freq: Every day | ORAL | 0 refills | Status: DC

## 2018-07-18 MED FILL — levoFLOXacin 500 MG TABS: 500 | 10 days supply | Qty: 10 | Fill #0

## 2018-07-18 MED FILL — predniSONE 10 MG (48) TBPK: 10 | 12 days supply | Qty: 48 | Fill #0

## 2018-07-18 NOTE — Progress Notes (Signed)
   Subjective:    Patient ID: Richard Trujillo, male    DOB: 11-Sep-1990, 27 y.o.   MRN: [id number]  HPI He is here for consult concerning continued difficulty with coughing and no wheezing.  He was treated October 14 with Biaxin and then switched to Augmentin on November 4.  He states that the Augmentin has had no positive effect at all.  He is now having continued coughing and within the last several days wheezing but no fever, chills, sore throat.  He has no history of underlying allergies or asthma.   Review of Systems     Objective:   Physical Exam Alert and in no distress. Tympanic membranes and canals are normal. Pharyngeal area is normal. Neck is supple without adenopathy or thyromegaly. Cardiac exam shows a regular sinus rhythm without murmurs or gallops. Lungs do show some rhonchi with forced expiration.        Assessment & Plan:  Acute bronchitis, unspecified organism - Plan: CBC with Differential/Platelet, Comprehensive metabolic panel, DG Chest 2 View, predniSONE (STERAPRED UNI-PAK 48 TAB) 10 MG (48) TBPK tablet, levofloxacin (LEVAQUIN) 500 MG tablet I will get more aggressive with his therapy, x-rays blood work switched to [place] and [employer].  Follow-up pending results of all of this.

## 2018-07-19 LAB — CBC WITH DIFFERENTIAL/PLATELET
Basophils Absolute: 0.1 10*3/uL (ref 0.0–0.2)
Basos: 1 %
EOS (ABSOLUTE): 0.5 10*3/uL — ABNORMAL HIGH (ref 0.0–0.4)
Eos: 6 %
Hematocrit: 42.9 % (ref 37.5–51.0)
Hemoglobin: 14.8 g/dL (ref 13.0–17.7)
Immature Grans (Abs): 0 10*3/uL (ref 0.0–0.1)
Immature Granulocytes: 0 %
Lymphocytes Absolute: 3 10*3/uL (ref 0.7–3.1)
Lymphs: 39 %
MCH: 29 pg (ref 26.6–33.0)
MCHC: 34.5 g/dL (ref 31.5–35.7)
MCV: 84 fL (ref 79–97)
Monocytes Absolute: 0.8 10*3/uL (ref 0.1–0.9)
Monocytes: 10 %
Neutrophils Absolute: 3.3 10*3/uL (ref 1.4–7.0)
Neutrophils: 44 %
Platelets: 345 10*3/uL (ref 150–450)
RBC: 5.11 x10E6/uL (ref 4.14–5.80)
RDW: 12.3 % (ref 12.3–15.4)
WBC: 7.6 10*3/uL (ref 3.4–10.8)

## 2018-07-19 LAB — COMPREHENSIVE METABOLIC PANEL
ALT: 30 IU/L (ref 0–44)
AST: 23 IU/L (ref 0–40)
Albumin/Globulin Ratio: 1.7 (ref 1.2–2.2)
Albumin: 4.9 g/dL (ref 3.5–5.5)
Alkaline Phosphatase: 81 IU/L (ref 39–117)
BUN/Creatinine Ratio: 12 (ref 9–20)
BUN: 10 mg/dL (ref 6–20)
Bilirubin Total: 0.4 mg/dL (ref 0.0–1.2)
CO2: 24 mmol/L (ref 20–29)
Calcium: 9.8 mg/dL (ref 8.7–10.2)
Chloride: 99 mmol/L (ref 96–106)
Creatinine, Ser: 0.85 mg/dL (ref 0.76–1.27)
GFR calc Af Amer: 136 mL/min/{1.73_m2} (ref >59)
GFR calc non Af Amer: 118 mL/min/{1.73_m2} (ref >59)
Globulin, Total: 2.9 g/dL (ref 1.5–4.5)
Glucose: 72 mg/dL (ref 65–99)
Potassium: 4.1 mmol/L (ref 3.5–5.2)
Sodium: 142 mmol/L (ref 134–144)
Total Protein: 7.8 g/dL (ref 6.0–8.5)

## 2018-09-06 ENCOUNTER — Ambulatory Visit (INDEPENDENT_AMBULATORY_CARE_PROVIDER_SITE_OTHER): Payer: 59 | Admitting: Family Medicine

## 2018-09-06 ENCOUNTER — Encounter: Payer: Self-pay | Admitting: Family Medicine

## 2018-09-06 VITALS — BP 128/88 | HR 91 | Temp 98.6°F | Ht 68.0 in | Wt 178.4 lb

## 2018-09-06 DIAGNOSIS — R062 Wheezing: Secondary | ICD-10-CM

## 2018-09-06 DIAGNOSIS — R059 Cough, unspecified: Secondary | ICD-10-CM

## 2018-09-06 DIAGNOSIS — Z0000 Encounter for general adult medical examination without abnormal findings: Secondary | ICD-10-CM | POA: Diagnosis not present

## 2018-09-06 DIAGNOSIS — R05 Cough: Secondary | ICD-10-CM

## 2018-09-06 DIAGNOSIS — R151 Fecal smearing: Secondary | ICD-10-CM

## 2018-09-06 NOTE — Progress Notes (Signed)
Subjective:    Patient ID: Richard Trujillo, male    DOB: Dec 28, 1990, 28 y.o.   MRN: [id number]  HPI He is here for complete examination.  He has been treated over the last several months with multiple antibiotics for treatment of bronchitis.  He states that it is better but he still having difficulty with intermittent cough and occasional wheezing.  He has no history of allergies or asthma.  He is also noted occasional difficulty with soiling as well as some rectal discomfort but he is not noticed any lesions down there.  Occasionally he has some blood but cannot relate this to bowel habits.  He does not complain of constipation.  His weight is been slowly going up.  He does recognize this and he and his wife are going to start back into weight watchers again.  He also has had some chronic back and neck discomfort and sees a chiropractor and gets good results with that.  Work and home life are going well.  He has 2 children.  Family and social history as well as health maintenance and immunizations was reviewed.   Review of Systems  All other systems reviewed and are negative.      Objective:   Physical Exam BP 128/88 (BP Location: Left Arm, Patient Position: Sitting)   Pulse 91   Temp 98.6 F (37 C)   Ht 5\' 8"  (1.727 m)   Wt 178 lb 6.4 oz (80.9 kg)   SpO2 97%   BMI 27.13 kg/m   General Appearance:    Alert, cooperative, no distress, appears stated age  Head:    Normocephalic, without obvious abnormality, atraumatic  Eyes:    PERRL, conjunctiva/corneas clear, EOM's intact, fundi    benign  Ears:    Normal TM's and external ear canals  Nose:   Nares normal, mucosa normal, no drainage or sinus   tenderness  Throat:   Lips, mucosa, and tongue normal; teeth and gums normal  Neck:   Supple, no lymphadenopathy;  thyroid:  no   enlargement/tenderness/nodules; no carotid   bruit or JVD  Back:    Spine nontender, no curvature, ROM normal, no CVA     tenderness  Lungs:     Clear to  auscultation bilaterally without wheezes, rales or     ronchi; respirations unlabored  Chest Wall:    No tenderness or deformity   Heart:    Regular rate and rhythm, S1 and S2 normal, no murmur, rub   or gallop  Breast Exam:    No chest wall tenderness, masses or gynecomastia  Abdomen:     Soft, non-tender, nondistended, normoactive bowel sounds,    no masses, no hepatosplenomegaly  Genitalia:    Normal male external genitalia without lesions.  Testicles without masses.  No inguinal hernias.  Rectal:   No hemorrhoids noted.  No evidence of fissure.  Digital rectal exam did show some tenderness at 6 and 12:00.  Extremities:   No clubbing, cyanosis or edema  Pulses:   2+ and symmetric all extremities  Skin:   Skin color, texture, turgor normal, no rashes or lesions  Lymph nodes:   Cervical, supraclavicular, and axillary nodes normal  Neurologic:   CNII-XII intact, normal strength, sensation and gait; reflexes 2+ and symmetric throughout          Psych:   Normal mood, affect, hygiene and grooming.    Pulmonary function testing did show mild airway obstruction       Assessment &  Plan:  Wheezing - Plan: Spirometry with graph, Ambulatory referral to Pulmonology  Cough - Plan: Spirometry with graph, Ambulatory referral to Pulmonology  Routine general medical examination at a health care facility  Soiling Since he has continued difficulty with cough and congestion I will have him see pulmonary for follow-up on that. Also discussed the swelling with him.  Recommend fluids, bulk in his diet to help keep him regular.  If continued difficulty may possibly refer to GI. He will continue to be followed by his chiropractor.

## 2018-09-24 ENCOUNTER — Ambulatory Visit: Payer: 59 | Admitting: Pulmonary Disease

## 2018-09-24 ENCOUNTER — Encounter: Payer: Self-pay | Admitting: Pulmonary Disease

## 2018-09-24 DIAGNOSIS — R062 Wheezing: Secondary | ICD-10-CM

## 2018-09-24 DIAGNOSIS — K219 Gastro-esophageal reflux disease without esophagitis: Secondary | ICD-10-CM | POA: Insufficient documentation

## 2018-09-24 MED ORDER — ALBUTEROL SULFATE HFA 108 (90 BASE) MCG/ACT IN AERS: 2.0000 | INHALATION_SPRAY | Freq: Four times a day (QID) | RESPIRATORY_TRACT | 2 refills | Status: DC | PRN

## 2018-09-24 MED FILL — VENTOLIN HFA 90 MCG INHALER: 108 (90 BAS | 25 days supply | Qty: 18 | Fill #0

## 2018-09-24 NOTE — Patient Instructions (Signed)
Lung function shows mild restriction-not typical for asthma.  We will treat gastroesophageal reflux with Pepcid 20 mg daily for 4 weeks. Call me to report how you are feeling after this trial.  Prescription for albuterol MDI 2 puffs every 6 hours as needed for wheezing or shortness of breath

## 2018-09-24 NOTE — Assessment & Plan Note (Signed)
We will treat gastroesophageal reflux with Pepcid 20 mg daily for 4 weeks. Call me to report how you are feeling after this trial.

## 2018-09-24 NOTE — Progress Notes (Signed)
Subjective:    Patient ID: Richard Trujillo, male    DOB: Oct 07, 1990, 28 y.o.   MRN: [id number]  HPI  Chief Complaint  Patient presents with  . Pulm Consult    Referred by Dr. Redmond School for increased wheezing and coughing since last May 2019. Denies any other symptoms.    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  He presents for evaluation of wheezing and shortness of breath.   He reports that since childhood he always had respiratory type issues".  Chest colds would always last 5 to 6 months before clearing although he was never diagnosed with asthma and never needed inhalers of any kind. He developed a dry cough around 03/2018 and required 3 rounds of antibiotics by his PCP last was levofloxacin and prednisone.  He then developed wheezing and episodic shortness of breath, both inspiratory and expiratory over the past 1 to 2 months.  This would be worse with exercise but will could also occur with exertion and occasionally woke him up at night.  Reports spontaneously subside. He reports occasional postnasal drip but has not needed medications for seasonal allergies.  He reports frequent heartburn and he takes Tums to 3 times a week.  Heartburn does not wake him up at night. He denies skin lesions or rash. He lives in a new construction home for the last 3 years with his wife and 2 children, 7 years old and a 36-month-old.  There are no pets.  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 Chest x-ray 07/18/2018 was personally reviewed which does not show any infiltrates or effusions. Previous chest x-ray from 2013 was also noted to be normal.  Spirometry was a good effort and showed ratio of 87 with FEV1 of 77% FVC of 73%, no evidence of airway obstruction but mild restriction  Past Medical History:  Diagnosis Date  . GERD (gastroesophageal reflux disease)   . History of EMG 01/20/12   EMG/NCS  . Neck pain    PT and neurology consults 12/2011    Past Surgical History:  Procedure  Laterality Date  . ESOPHAGOGASTRODUODENOSCOPY  10/24/11   Dr. Hilarie Fredrickson    Allergies  Allergen Reactions  . Naproxen Nausea And Vomiting  . Prilosec [Omeprazole Magnesium]     Chest pains  . Pseudoephedrine     Social History   Socioeconomic History  . Marital status: Single    Spouse name: Not on file  . Number of children: 0  . Years of education: Not on file  . Highest education level: Not on file  Occupational History  . Occupation: Building control surveyor: [employer]  . Financial resource strain: Not on file  . Food insecurity:    Worry: Not on file    Inability: Not on file  . Transportation needs:    Medical: Not on file    Non-medical: Not on file  Tobacco Use  . Smoking status: Never Smoker  . Smokeless tobacco: Never Used  Substance and Sexual Activity  . Alcohol use: No    Alcohol/week: 0.0 standard drinks  . Drug use: No  . Sexual activity: Yes  Lifestyle  . Physical activity:    Days per week: Not on file    Minutes per session: Not on file  . Stress: Not on file  Relationships  . Social connections:    Talks on phone: Not on file    Gets together: Not on file  Attends religious service: Not on file    Active member of club or organization: Not on file    Attends meetings of clubs or organizations: Not on file    Relationship status: Not on file  . Intimate partner violence:    Fear of current or ex partner: Not on file    Emotionally abused: Not on file    Physically abused: Not on file    Forced sexual activity: Not on file  Other Topics Concern  . Not on file  Social History Narrative   Married, wife is pregnant (04/2015 visit)     Family History  Problem Relation Age of Onset  . Heart disease Paternal Grandmother       Review of Systems  Constitutional: Negative for fever and unexpected weight change.  HENT: Negative for congestion, dental problem, ear pain, nosebleeds, postnasal drip, rhinorrhea, sinus pressure, sneezing,  sore throat and trouble swallowing.   Eyes: Negative for redness and itching.  Respiratory: Positive for cough and shortness of breath. Negative for chest tightness and wheezing.   Cardiovascular: Negative for palpitations and leg swelling.  Gastrointestinal: Negative for nausea and vomiting.  Genitourinary: Negative for dysuria.  Musculoskeletal: Negative for joint swelling.  Skin: Negative for rash.  Allergic/Immunologic: Negative.  Negative for environmental allergies, food allergies and immunocompromised state.  Neurological: Negative for headaches.  Hematological: Does not bruise/bleed easily.  Psychiatric/Behavioral: Negative for dysphoric mood. The patient is not nervous/anxious.        Objective:   Physical Exam  Gen. Pleasant, well-nourished, in no distress, normal affect ENT - no pallor,icterus, no post nasal drip Neck: No JVD, no thyromegaly, no carotid bruits Lungs: no use of accessory muscles, no dullness to percussion, clear without rales or rhonchi  Cardiovascular: Rhythm regular, heart sounds  normal, no murmurs or gallops, no peripheral edema Abdomen: soft and non-tender, no hepatosplenomegaly, BS normal. Musculoskeletal: No deformities, no cyanosis or clubbing Neuro:  alert, non focal       Assessment & Plan:

## 2018-09-24 NOTE — Assessment & Plan Note (Addendum)
Trigger appears to be most likely reflux rather than seasonal allergies Doubt true asthma here.  No reason so far to suspect bronchiectasis  Lung function shows mild restriction-not typical for asthma, likely developmental  Prescription for albuterol MDI 2 puffs every 6 hours as needed for wheezing or shortness of breath

## 2018-12-04 ENCOUNTER — Encounter: Payer: Self-pay | Admitting: Family Medicine

## 2018-12-11 ENCOUNTER — Ambulatory Visit: Payer: 59 | Admitting: Pulmonary Disease

## 2019-06-25 DIAGNOSIS — Z20828 Contact with and (suspected) exposure to other viral communicable diseases: Secondary | ICD-10-CM | POA: Diagnosis not present

## 2019-09-09 ENCOUNTER — Encounter: Payer: 59 | Admitting: Family Medicine

## 2019-09-20 ENCOUNTER — Other Ambulatory Visit: Payer: Self-pay

## 2019-09-20 ENCOUNTER — Ambulatory Visit (HOSPITAL_COMMUNITY)
Admission: EM | Admit: 2019-09-20 | Discharge: 2019-09-20 | Disposition: A | Discharge status: Home / Self Care | Payer: 59 | Attending: Family Medicine | Admitting: Family Medicine

## 2019-09-20 ENCOUNTER — Encounter (HOSPITAL_COMMUNITY): Payer: Self-pay | Admitting: Emergency Medicine

## 2019-09-20 DIAGNOSIS — L02213 Cutaneous abscess of chest wall: Secondary | ICD-10-CM

## 2019-09-20 MED ORDER — CEPHALEXIN 500 MG PO CAPS: 500.0000 mg | ORAL_CAPSULE | Freq: Four times a day (QID) | ORAL | 0 refills | Status: AC

## 2019-09-20 NOTE — Discharge Instructions (Signed)
Keep dressing in place for the next 24 hours. May remove tomorrow.  Then cleanse area daily with soap and water.  Keep covered to keep protected and collect drainage.   Complete course of antibiotics.  Ibuprofen as needed for pain.  Warm compresses regularly, every couple of hours, to promote further drainage.  If symptoms worsen or do not improve in the next week to return to be seen or to follow up with your PCP.

## 2019-09-20 NOTE — ED Provider Notes (Signed)
[name]    CSN: [id number] Arrival date & time: 09/20/19  1538      History   Chief Complaint Chief Complaint  Patient presents with  . Mass    HPI Richard Trujillo is a 29 y.o. male.   Cornelius Moras presents with complaints of abscess to left chest wall. First noted months ago, it was small, he suspected ingrown hair. Recently worsened and increased in size and redness. He used tweezers and was able to express small amount of pus. Then over the past few days it has increased in size and redness as well as pain. No further drainage. No fevers. No known MRSA history.     ROS per HPI, negative if not otherwise mentioned.      Past Medical History:  Diagnosis Date  . GERD (gastroesophageal reflux disease)   . History of EMG 01/20/12   EMG/NCS  . Neck pain    PT and neurology consults 12/2011    Patient Active Problem List   Diagnosis Date Noted  . Wheezing 09/24/2018  . GERD (gastroesophageal reflux disease) 09/24/2018    Past Surgical History:  Procedure Laterality Date  . ESOPHAGOGASTRODUODENOSCOPY  10/24/11   Dr. Hilarie Fredrickson       Home Medications    Prior to Admission medications   Medication Sig Start Date End Date Taking? Authorizing Provider  albuterol (PROVENTIL HFA;VENTOLIN HFA) 108 (90 Base) MCG/ACT inhaler Inhale 2 puffs into the lungs every 6 (six) hours as needed for wheezing or shortness of breath. 09/24/18   Rigoberto Noel, MD  cephALEXin (KEFLEX) 500 MG capsule Take 1 capsule (500 mg total) by mouth 4 (four) times daily for 7 days. 09/20/19 09/27/19  Zigmund Gottron, NP    Family History Family History  Problem Relation Age of Onset  . Heart disease Paternal Grandmother     Social History Social History   Tobacco Use  . Smoking status: Never Smoker  . Smokeless tobacco: Never Used  Substance Use Topics  . Alcohol use: No    Alcohol/week: 0.0 standard drinks  . Drug use: No     Allergies   Naproxen, Prilosec  [omeprazole magnesium], and Pseudoephedrine   Review of Systems Review of Systems   Physical Exam Triage Vital Signs ED Triage Vitals [09/20/19 1615]  Enc Vitals Group     BP (!) 135/92     Pulse Rate 89     Resp 16     Temp 98.5 F (36.9 C)     Temp src      SpO2 100 %     Weight      Height      Head Circumference      Peak Flow      Pain Score 0     Pain Loc      Pain Edu?      Excl. in [place]?    No data found.  Updated Vital Signs BP (!) 135/92   Pulse 89   Temp 98.5 F (36.9 C)   Resp 16   SpO2 100%   Visual Acuity Right Eye Distance:   Left Eye Distance:   Bilateral Distance:    Right Eye Near:   Left Eye Near:    Bilateral Near:     Physical Exam Constitutional:      Appearance: He is well-developed.  Cardiovascular:     Rate and Rhythm: Normal rate.  Pulmonary:     Effort: Pulmonary effort is normal.  Chest:       Comments: Approximately 2 cm in total area of redness with 1 cm at center of fluctuant and white colored pustule, no active drainage; tender Skin:    General: Skin is warm and dry.  Neurological:     Mental Status: He is alert and oriented to person, place, and time.      UC Treatments / Results  Labs (all labs ordered are listed, but only abnormal results are displayed) Labs Reviewed - No data to display  EKG   Radiology No results found.  Procedures Incision and Drainage  Date/Time: 09/20/2019 4:52 PM Performed by: Zigmund Gottron, NP Authorized by: Raylene Everts, MD   Consent:    Consent obtained:  Verbal   Consent given by:  Patient   Risks discussed:  Bleeding, incomplete drainage, pain and infection   Alternatives discussed:  Alternative treatment, delayed treatment and referral Location:    Type:  Abscess   Size:  1.5   Location:  Trunk   Trunk location:  L breast Pre-procedure details:    Procedure prep: alcohol  Anesthesia (see MAR for exact dosages):    Anesthesia method:  Topical  application   Topical anesthesia: freeze spray  Procedure type:    Complexity:  Simple Procedure details:    Incision types:  Stab incision   Scalpel blade:  11   Drainage:  Purulent   Drainage amount:  Moderate   Wound treatment:  Wound left open   Packing materials:  None Post-procedure details:    Patient tolerance of procedure:  Tolerated well, no immediate complications   (including critical care time)  Medications Ordered in UC Medications - No data to display  Initial Impression / Assessment and Plan / UC Course  I have reviewed the triage vital signs and the nursing notes.  Pertinent labs & imaging results that were available during my care of the patient were reviewed by me and considered in my medical decision making (see chart for details).     Stab incision which produced large amount of drainage and successful decompression. Antibiotics provided and wound care discussed. Return precautions provided. Patient verbalized understanding and agreeable to plan.   Final Clinical Impressions(s) / UC Diagnoses   Final diagnoses:  Abscess of chest wall     Discharge Instructions     Keep dressing in place for the next 24 hours. May remove tomorrow.  Then cleanse area daily with soap and water.  Keep covered to keep protected and collect drainage.   Complete course of antibiotics.  Ibuprofen as needed for pain.  Warm compresses regularly, every couple of hours, to promote further drainage.  If symptoms worsen or do not improve in the next week to return to be seen or to follow up with your PCP.     ED Prescriptions    Medication Sig Dispense Auth. Provider   cephALEXin (KEFLEX) 500 MG capsule Take 1 capsule (500 mg total) by mouth 4 (four) times daily for 7 days. 28 capsule Zigmund Gottron, NP     PDMP not reviewed this encounter.   Zigmund Gottron, NP 09/20/19 [phone]

## 2019-09-20 NOTE — ED Triage Notes (Signed)
Pt states he has a bump on his chest for the last few months, states its progressively gotten bigger and more painful. Squeezed it recently and some pus came out.

## 2019-09-23 ENCOUNTER — Other Ambulatory Visit: Payer: Self-pay

## 2019-09-23 ENCOUNTER — Ambulatory Visit: Payer: 59 | Attending: Internal Medicine

## 2019-09-23 DIAGNOSIS — Z20822 Contact with and (suspected) exposure to covid-19: Secondary | ICD-10-CM | POA: Diagnosis not present

## 2019-09-24 ENCOUNTER — Ambulatory Visit: Payer: 59 | Admitting: Family Medicine

## 2019-09-24 LAB — NOVEL CORONAVIRUS, NAA: SARS-CoV-2, NAA: NOT DETECTED

## 2019-10-22 ENCOUNTER — Encounter: Payer: 59 | Admitting: Family Medicine

## 2019-11-20 ENCOUNTER — Encounter: Payer: Self-pay | Admitting: Physician Assistant

## 2019-11-20 ENCOUNTER — Other Ambulatory Visit: Payer: Self-pay

## 2019-11-20 ENCOUNTER — Encounter: Payer: Self-pay | Admitting: Family Medicine

## 2019-11-20 ENCOUNTER — Ambulatory Visit (INDEPENDENT_AMBULATORY_CARE_PROVIDER_SITE_OTHER): Payer: 59 | Admitting: Family Medicine

## 2019-11-20 VITALS — BP 120/82 | HR 63 | Temp 98.0°F | Ht 68.5 in | Wt 176.8 lb

## 2019-11-20 DIAGNOSIS — R151 Fecal smearing: Secondary | ICD-10-CM

## 2019-11-20 DIAGNOSIS — Z0000 Encounter for general adult medical examination without abnormal findings: Secondary | ICD-10-CM

## 2019-11-20 DIAGNOSIS — K219 Gastro-esophageal reflux disease without esophagitis: Secondary | ICD-10-CM | POA: Diagnosis not present

## 2019-11-20 LAB — POCT URINALYSIS DIP (PROADVANTAGE DEVICE)
Bilirubin, UA: NEGATIVE
Blood, UA: NEGATIVE
Glucose, UA: NEGATIVE mg/dL
Ketones, POC UA: NEGATIVE mg/dL
Leukocytes, UA: NEGATIVE
Nitrite, UA: NEGATIVE
Protein Ur, POC: NEGATIVE mg/dL
Specific Gravity, Urine: 1.015
Urobilinogen, Ur: 0.2
pH, UA: 7 (ref 5.0–8.0)

## 2019-11-20 NOTE — Progress Notes (Signed)
   Subjective:    Patient ID: Richard Trujillo, male    DOB: 1990-09-25, 29 y.o.   MRN: [id number]  HPI He is here for complete examination.  He has had difficulty with reflux disease manifesting as a cough.  He did try Pepcid which apparently did not help much however his symptoms have essentially diminished.  He still does have occasional difficulty with the rectal discomfort and a feeling of swelling.  This was evaluated in the past and nothing initially showed up.  He would like to pursue this further.  He has no other concerns or complaints.  His marriage is going well.  He has 2 children.  Family and social history as well as health maintenance and immunizations was reviewed.  He does have his Covid shots.   Review of Systems  All other systems reviewed and are negative.      Objective:   Physical Exam Alert and in no distress. Tympanic membranes and canals are normal. Pharyngeal area is normal. Neck is supple without adenopathy or thyromegaly. Cardiac exam shows a regular sinus rhythm without murmurs or gallops. Lungs are clear to auscultation.  Abdominal exam shows no masses or tenderness with normal bowel sounds        Assessment & Plan:  Routine general medical examination at a health care facility - Plan: POCT Urinalysis DIP (Proadvantage Device), Lipid Panel, CBC with Differential, Comprehensive metabolic panel  Soiling - Plan: Ambulatory referral to Gastroenterology  Gastroesophageal reflux disease without esophagitis I will refer to GI for further evaluation x-ray swelling.  He was having no symptoms so exam was not done at this time

## 2019-11-21 LAB — COMPREHENSIVE METABOLIC PANEL
ALT: 24 IU/L (ref 0–44)
AST: 21 IU/L (ref 0–40)
Albumin/Globulin Ratio: 1.7 (ref 1.2–2.2)
Albumin: 4.9 g/dL (ref 4.1–5.2)
Alkaline Phosphatase: 84 IU/L (ref 39–117)
BUN/Creatinine Ratio: 9 (ref 9–20)
BUN: 9 mg/dL (ref 6–20)
Bilirubin Total: 0.5 mg/dL (ref 0.0–1.2)
CO2: 24 mmol/L (ref 20–29)
Calcium: 9.9 mg/dL (ref 8.7–10.2)
Chloride: 102 mmol/L (ref 96–106)
Creatinine, Ser: 1.01 mg/dL (ref 0.76–1.27)
GFR calc Af Amer: 115 mL/min/{1.73_m2} (ref >59)
GFR calc non Af Amer: 99 mL/min/{1.73_m2} (ref >59)
Globulin, Total: 2.9 g/dL (ref 1.5–4.5)
Glucose: 86 mg/dL (ref 65–99)
Potassium: 4.1 mmol/L (ref 3.5–5.2)
Sodium: 142 mmol/L (ref 134–144)
Total Protein: 7.8 g/dL (ref 6.0–8.5)

## 2019-11-21 LAB — CBC WITH DIFFERENTIAL/PLATELET
Basophils Absolute: 0 10*3/uL (ref 0.0–0.2)
Basos: 1 %
EOS (ABSOLUTE): 0.3 10*3/uL (ref 0.0–0.4)
Eos: 4 %
Hematocrit: 45 % (ref 37.5–51.0)
Hemoglobin: 15.6 g/dL (ref 13.0–17.7)
Immature Grans (Abs): 0 10*3/uL (ref 0.0–0.1)
Immature Granulocytes: 0 %
Lymphocytes Absolute: 2.3 10*3/uL (ref 0.7–3.1)
Lymphs: 30 %
MCH: 30.1 pg (ref 26.6–33.0)
MCHC: 34.7 g/dL (ref 31.5–35.7)
MCV: 87 fL (ref 79–97)
Monocytes Absolute: 0.7 10*3/uL (ref 0.1–0.9)
Monocytes: 9 %
Neutrophils Absolute: 4.2 10*3/uL (ref 1.4–7.0)
Neutrophils: 56 %
Platelets: 384 10*3/uL (ref 150–450)
RBC: 5.18 x10E6/uL (ref 4.14–5.80)
RDW: 12.7 % (ref 11.6–15.4)
WBC: 7.5 10*3/uL (ref 3.4–10.8)

## 2019-11-21 LAB — LIPID PANEL
Chol/HDL Ratio: 3.7 ratio (ref 0.0–5.0)
Cholesterol, Total: 138 mg/dL (ref 100–199)
HDL: 37 mg/dL — ABNORMAL LOW (ref >39)
LDL Chol Calc (NIH): 83 mg/dL (ref 0–99)
Triglycerides: 95 mg/dL (ref 0–149)
VLDL Cholesterol Cal: 18 mg/dL (ref 5–40)

## 2019-12-03 ENCOUNTER — Encounter: Payer: Self-pay | Admitting: Physician Assistant

## 2019-12-03 ENCOUNTER — Ambulatory Visit: Payer: 59 | Admitting: Physician Assistant

## 2019-12-03 VITALS — BP 104/70 | HR 88 | Temp 97.8°F | Ht 68.75 in | Wt 180.4 lb

## 2019-12-03 DIAGNOSIS — L290 Pruritus ani: Secondary | ICD-10-CM

## 2019-12-03 DIAGNOSIS — K625 Hemorrhage of anus and rectum: Secondary | ICD-10-CM

## 2019-12-03 DIAGNOSIS — K640 First degree hemorrhoids: Secondary | ICD-10-CM

## 2019-12-03 DIAGNOSIS — R159 Full incontinence of feces: Secondary | ICD-10-CM | POA: Diagnosis not present

## 2019-12-03 MED ORDER — HYDROCORTISONE ACETATE 25 MG RE SUPP: 25.0000 mg | Freq: Two times a day (BID) | RECTAL | 1 refills | Status: AC

## 2019-12-03 MED FILL — HYDROCORTISONE AC 25 MG SUP: 25 | 7 days supply | Qty: 14 | Fill #0

## 2019-12-03 NOTE — Patient Instructions (Signed)
If you are age 31 or older, your body mass index should be between 23-30. Your Body mass index is 26.83 kg/m. If this is out of the aforementioned range listed, please consider follow up with your Primary Care Provider.  If you are age 53 or younger, your body mass index should be between 19-25. Your Body mass index is 26.83 kg/m. If this is out of the aformentioned range listed, please consider follow up with your Primary Care Provider.   We have sent the following medications to your pharmacy for you to pick up at your convenience: Hydrocortisone suppositories twice daily for 7 days.

## 2019-12-03 NOTE — Progress Notes (Addendum)
Chief Complaint: Soiling, hemorrhoids, rectal bleeding  HPI:    Mr. Richard Trujillo is a 29 year old male with a past medical history of GERD and others listed below, known to Dr. Elmo Putt for his epigastric pain and previous EGD, who was referred to me by Denita Lung, MD for a complaint of hemorrhoids, rectal bleeding and soiling.      07/10/2012 patient saw Dr. Elmo Putt in the clinic at that time discussed he underwent upper endoscopy which revealed mild gastritis negative for H. pylori.  Also had issues with constipation was started on MiraLAX.  At that time he was complaining of epigastric and left upper quadrant abdominal pain which had resolved.  At that time both his pain and his constipation was better.    11/20/2019 patient saw PCP and they discussed that he had difficulty with reflux manifesting as a cough.  Had tried Pepcid which did not help.  Also still occasionally having rectal discomfort and a feeling of swelling.    11/20/2019 CMP, CBC normal.    Today, the patient tells me that since 2019 every time that he has a bowel movement he will have to go back multiple times because he gets itchy afterwards and has to wipe and will have some more fecal residue.  He does this so often that sometimes he will see some bright red blood which he thinks is from irritation.  Also describes some rectal pain towards the bottom of his rectum when passing stool which feels sharp in nature.  Has tried treating for hemorrhoids before with some Preparation H cream on the outside.    Currently the patient tells me his reflux is under control.  He thinks he has respiratory issues which take a long time to clear up not reflux.    Works at [employer] in [employer].    Denies fever, chills, weight loss or symptoms that awaken him from sleep.  Past Medical History:  Diagnosis Date  . GERD (gastroesophageal reflux disease)   . History of EMG 01/20/12   EMG/NCS  . Neck pain    PT and neurology consults 12/2011    Past Surgical  History:  Procedure Laterality Date  . ESOPHAGOGASTRODUODENOSCOPY  10/24/11   Dr. Hilarie Fredrickson    Current Outpatient Medications  Medication Sig Dispense Refill  . acetaminophen (TYLENOL) 500 MG tablet Take 500-1,000 mg by mouth as needed.    . calcium carbonate (TUMS - DOSED IN MG ELEMENTAL CALCIUM) 500 MG chewable tablet Chew 1 tablet by mouth as needed for indigestion or heartburn.    Marland Kitchen albuterol (PROVENTIL HFA;VENTOLIN HFA) 108 (90 Base) MCG/ACT inhaler Inhale 2 puffs into the lungs every 6 (six) hours as needed for wheezing or shortness of breath. (Patient not taking: Reported on 12/03/2019) 1 Inhaler 2   No current facility-administered medications for this visit.    Allergies as of 12/03/2019 - Review Complete 12/03/2019  Allergen Reaction Noted  . Naproxen Nausea And Vomiting 12/10/2011  . Prilosec [omeprazole magnesium]  12/10/2011  . Pseudoephedrine  08/24/2016    Family History  Problem Relation Age of Onset  . Heart disease Paternal Grandmother     Social History   Socioeconomic History  . Marital status: Married    Spouse name: Not on file  . Number of children: 2  . Years of education: Not on file  . Highest education level: Not on file  Occupational History  . Occupation: Building control surveyor: [employer]  Tobacco Use  . Smoking status:  Never Smoker  . Smokeless tobacco: Never Used  Substance and Sexual Activity  . Alcohol use: No    Alcohol/week: 0.0 standard drinks  . Drug use: No  . Sexual activity: Yes  Other Topics Concern  . Not on file  Social History Narrative   Married, wife is pregnant (04/2015 visit)   Social Determinants of Health   Financial Resource Strain:   . Difficulty of Paying Living Expenses:   Food Insecurity:   . Worried About Charity fundraiser in the Last Year:   . Arboriculturist in the Last Year:   Transportation Needs:   . Film/video editor (Medical):   Marland Kitchen Lack of Transportation (Non-Medical):   Physical Activity:   . Days  of Exercise per Week:   . Minutes of Exercise per Session:   Stress:   . Feeling of Stress :   Social Connections:   . Frequency of Communication with Friends and Family:   . Frequency of Social Gatherings with Friends and Family:   . Attends Religious Services:   . Active Member of Clubs or Organizations:   . Attends Archivist Meetings:   Marland Kitchen Marital Status:   Intimate Partner Violence:   . Fear of Current or Ex-Partner:   . Emotionally Abused:   Marland Kitchen Physically Abused:   . Sexually Abused:     Review of Systems:    Constitutional: No weight loss, fever or chills Skin: No rash  Cardiovascular: No chest pain Respiratory: No SOB  Gastrointestinal: See HPI and otherwise negative Genitourinary: No dysuria Neurological: No headache Musculoskeletal: No new muscle or joint pain Hematologic: No bruising Psychiatric: No history of depression or anxiety   Physical Exam:  Vital signs: BP 104/70 (BP Location: Left Arm, Patient Position: Sitting, Cuff Size: Normal)   Pulse 88   Temp 97.8 F (36.6 C)   Ht 5' 8.75" (1.746 m) Comment: height measured without shoes  Wt 180 lb 6 oz (81.8 kg)   BMI 26.83 kg/m   Constitutional:   Pleasant Caucasian male appears to be in NAD, Well developed, Well nourished, alert and cooperative Head:  Normocephalic and atraumatic. Eyes:   PEERL, EOMI. No icterus. Conjunctiva pink. Ears:  Normal auditory acuity. Neck:  Supple Throat: Oral cavity and pharynx without inflammation, swelling or lesion.  Respiratory: Respirations even and unlabored. Lungs clear to auscultation bilaterally.   No wheezes, crackles, or rhonchi.  Cardiovascular: Normal S1, S2. No MRG. Regular rate and rhythm. No peripheral edema, cyanosis or pallor.  Gastrointestinal:  Soft, nondistended, nontender. No rebound or guarding. Normal bowel sounds. No appreciable masses or hepatomegaly. Rectal:  External: mild perirectal erythema; Internal: no mass; Anoscopy: Grade I  hemorrhoids Msk:  Symmetrical without gross deformities. Without edema, no deformity or joint abnormality.  Neurologic:  Alert and  oriented x4;  grossly normal neurologically.  Skin:   Dry and intact without significant lesions or rashes.  Psychiatric:  Demonstrates good judgement and reason without abnormal affect or behaviors.  RELEVANT LABS AND IMAGING: CBC    Component Value Date/Time   WBC 7.5 11/20/2019 1105   WBC 9.9 09/06/2017 1500   RBC 5.18 11/20/2019 1105   RBC 5.13 09/06/2017 1500   HGB 15.6 11/20/2019 1105   HCT 45.0 11/20/2019 1105   PLT 384 11/20/2019 1105   MCV 87 11/20/2019 1105   MCH 30.1 11/20/2019 1105   MCH 29.0 09/06/2017 1500   MCHC 34.7 11/20/2019 1105   MCHC 34.3 09/06/2017 1500  RDW 12.7 11/20/2019 1105   LYMPHSABS 2.3 11/20/2019 1105   MONOABS 0.9 12/10/2011 2358   EOSABS 0.3 11/20/2019 1105   BASOSABS 0.0 11/20/2019 1105    CMP     Component Value Date/Time   NA 142 11/20/2019 1105   K 4.1 11/20/2019 1105   CL 102 11/20/2019 1105   CO2 24 11/20/2019 1105   GLUCOSE 86 11/20/2019 1105   GLUCOSE 101 (H) 09/06/2017 1500   BUN 9 11/20/2019 1105   CREATININE 1.01 11/20/2019 1105   CREATININE 0.97 09/06/2017 1500   CALCIUM 9.9 11/20/2019 1105   PROT 7.8 11/20/2019 1105   ALBUMIN 4.9 11/20/2019 1105   AST 21 11/20/2019 1105   ALT 24 11/20/2019 1105   ALKPHOS 84 11/20/2019 1105   BILITOT 0.5 11/20/2019 1105   GFRNONAA 99 11/20/2019 1105   GFRAA 115 11/20/2019 1105    Assessment: 1.  Grade 1 hemorrhoids: Seen at time of anoscopy today, likely because of some fecal leakage rectal itching and occasional bleeding 2.  Rectal itching 3.  Rectal bleeding  Plan: 1.  Prescribed Hydrocortisone suppositories to be used twice daily for a week with 1 refill. 2.  Discussed with the patient that if above does not help with his symptoms of fecal leakage/soiling and itching then he should call and follow-up with [place].  At that point may need to consider  colonoscopy for further evaluation under anesthesia. 3.  Patient to follow in clinic as needed.  Ellouise Newer, PA-C [facility] 12/03/2019, 9:14 AM  Cc: Denita Lung, MD   Addendum: Reviewed and agree with assessment and management plan. Adding Metamucil 1-2 tablespoons 1-2 x daily may significantly help with fecal smearing. Agree with direct visualization if persistent complaints. Pyrtle, Lajuan Lines, MD

## 2020-01-16 ENCOUNTER — Telehealth: Payer: Self-pay

## 2020-01-16 NOTE — Telephone Encounter (Signed)
The pt has been scheduled for follow up on 6/4.  The pt has been advised of the information and verbalized understanding.

## 2020-01-16 NOTE — Telephone Encounter (Signed)
-----   Message from Levin Erp, [place] sent at 01/16/2020  8:37 AM EDT ----- Regarding: Call please Please call the patient, he said that suppositories did not work to help with his hemorrhoids/fecal incontinence.  He needs a follow-up visit with me to discuss colonoscopy.  Thanks-JLL

## 2020-01-28 ENCOUNTER — Encounter: Payer: Self-pay | Admitting: *Deleted

## 2020-01-31 ENCOUNTER — Encounter: Payer: Self-pay | Admitting: Physician Assistant

## 2020-01-31 ENCOUNTER — Ambulatory Visit (INDEPENDENT_AMBULATORY_CARE_PROVIDER_SITE_OTHER): Payer: 59 | Admitting: Physician Assistant

## 2020-01-31 VITALS — BP 110/70 | HR 84 | Ht 69.0 in | Wt 181.0 lb

## 2020-01-31 DIAGNOSIS — K625 Hemorrhage of anus and rectum: Secondary | ICD-10-CM

## 2020-01-31 DIAGNOSIS — R159 Full incontinence of feces: Secondary | ICD-10-CM | POA: Diagnosis not present

## 2020-01-31 MED ORDER — SUTAB 1479-225-188 MG PO TABS: ORAL_TABLET | ORAL | 0 refills | Status: DC

## 2020-01-31 NOTE — Progress Notes (Signed)
Chief Complaint: Follow-up soiling, hemorrhoids and rectal bleeding  HPI:    Mr. Iversen is a 29 year old Caucasian male with a past medical history as listed below, known to Dr.Pyrtle, who returns to clinic today for follow-up of his soiling, hemorrhoids and rectal bleeding.     12/03/2019 patient seen in clinic and described that every time he has a bowel movement he has to go back multiple times as he gets itching afterwards and has to wipe and will have some more fecal residue, he also sees some bright red blood from time to time and has some rectal pain when passing a stool.  Rectal exam at that time with mild perirectal erythema, anoscopy with grade 1 hemorrhoids.  Given hydrocortisone suppositories twice daily for a week with 1 refill.  Discussed that this did not help and may need to consider colonoscopy.    Today, the patient tells me he tried the suppositories but they really made no difference at all.  He continues with feeling like he still has to wipe after bowel movement multiple times and some fecal soiling as well as seeing some bright red blood on the toilet paper and some rectal discomfort.  Tells me he has tried fiber Gummies in the past but never tried any other forms of fiber supplementation.  Denies any other changes to his symptoms.    Social history positive for working at [employer] in [employer].  He has two small sons, the youngest turned 1 in January.    Denies fever, chills or weight loss.  Past Medical History:  Diagnosis Date  . Gastritis   . GERD (gastroesophageal reflux disease)   . Hiatal hernia   . History of EMG 01/20/12   EMG/NCS  . Neck pain    PT and neurology consults 12/2011    Past Surgical History:  Procedure Laterality Date  . ESOPHAGOGASTRODUODENOSCOPY  10/24/11   Dr. Hilarie Fredrickson    Current Outpatient Medications  Medication Sig Dispense Refill  . acetaminophen (TYLENOL) 500 MG tablet Take 500-1,000 mg by mouth as needed.    Marland Kitchen albuterol (PROVENTIL HFA;VENTOLIN HFA)  108 (90 Base) MCG/ACT inhaler Inhale 2 puffs into the lungs every 6 (six) hours as needed for wheezing or shortness of breath. 1 Inhaler 2  . calcium carbonate (TUMS - DOSED IN MG ELEMENTAL CALCIUM) 500 MG chewable tablet Chew 1 tablet by mouth as needed for indigestion or heartburn.     No current facility-administered medications for this visit.    Allergies as of 01/31/2020 - Review Complete 01/31/2020  Allergen Reaction Noted  . Naproxen Nausea And Vomiting 12/10/2011  . Prilosec [omeprazole magnesium]  12/10/2011  . Pseudoephedrine  08/24/2016    Family History  Problem Relation Age of Onset  . Heart disease Paternal Grandmother     Social History   Socioeconomic History  . Marital status: Married    Spouse name: Not on file  . Number of children: 2  . Years of education: Not on file  . Highest education level: Not on file  Occupational History  . Occupation: Building control surveyor: [employer]  Tobacco Use  . Smoking status: Never Smoker  . Smokeless tobacco: Never Used  Substance and Sexual Activity  . Alcohol use: No    Alcohol/week: 0.0 standard drinks  . Drug use: No  . Sexual activity: Yes  Other Topics Concern  . Not on file  Social History Narrative   Married, wife is pregnant (04/2015 visit)   Social  Determinants of Health   Financial Resource Strain:   . Difficulty of Paying Living Expenses:   Food Insecurity:   . Worried About Charity fundraiser in the Last Year:   . Arboriculturist in the Last Year:   Transportation Needs:   . Film/video editor (Medical):   Marland Kitchen Lack of Transportation (Non-Medical):   Physical Activity:   . Days of Exercise per Week:   . Minutes of Exercise per Session:   Stress:   . Feeling of Stress :   Social Connections:   . Frequency of Communication with Friends and Family:   . Frequency of Social Gatherings with Friends and Family:   . Attends Religious Services:   . Active Member of Clubs or Organizations:   . Attends  Archivist Meetings:   Marland Kitchen Marital Status:   Intimate Partner Violence:   . Fear of Current or Ex-Partner:   . Emotionally Abused:   Marland Kitchen Physically Abused:   . Sexually Abused:     Review of Systems:    Constitutional: No weight loss, fever or chills Cardiovascular: No chest pain   Respiratory: No SOB  Gastrointestinal: See HPI and otherwise negative   Physical Exam:  Vital signs: BP 110/70   Pulse 84   Ht 5\' 9"  (1.753 m)   Wt 181 lb (82.1 kg)   BMI 26.73 kg/m   Constitutional:   Pleasant Caucasian male appears to be in NAD, Well developed, Well nourished, alert and cooperative Respiratory: Respirations even and unlabored. Lungs clear to auscultation bilaterally.   No wheezes, crackles, or rhonchi.  Cardiovascular: Normal S1, S2. No MRG. Regular rate and rhythm. No peripheral edema, cyanosis or pallor.  Gastrointestinal:  Soft, nondistended, nontender. No rebound or guarding. Normal bowel sounds. No appreciable masses or hepatomegaly. Rectal:  Not performed.  Psychiatric: Demonstrates good judgement and reason without abnormal affect or behaviors.  No recent labs/imaging.  Assessment: 1.  Fecal incontinence: Thought related to hemorrhoids but no change after suppositories; could still be hemorrhoids but need to rule out other causes 2.  Rectal pain 3.  Rectal bleeding: Thought due to irritation in the past, but need to rule out other causes  Plan: 1.  Discussed adding in a fiber supplement such as Metamucil or Benefiber. 2.  Scheduled patient for colonoscopy for direct visualization with Dr. Hilarie Fredrickson in the [facility].  Did discuss risks, benefits, limitations and alternatives and patient agrees to proceed. 3.  Patient to follow in clinic per recommendations from Dr. Hilarie Fredrickson after time of procedure.  Ellouise Newer, PA-C [facility] 01/31/2020, 2:16 PM  Cc: Denita Lung, MD

## 2020-01-31 NOTE — Patient Instructions (Signed)
If you are age 31 or older, your body mass index should be between 23-30. Your Body mass index is 26.73 kg/m. If this is out of the aforementioned range listed, please consider follow up with your Primary Care Provider.  If you are age 32 or younger, your body mass index should be between 19-25. Your Body mass index is 26.73 kg/m. If this is out of the aformentioned range listed, please consider follow up with your Primary Care Provider.   You have been scheduled for a colonoscopy. Please follow written instructions given to you at your visit today.  Please pick up your prep supplies at the pharmacy within the next 1-3 days. If you use inhalers (even only as needed), please bring them with you on the day of your procedure.  It was a pleasure to see you today!  Ellouise Newer PA-C

## 2020-02-04 NOTE — Progress Notes (Signed)
Addendum: Reviewed and agree with assessment and management plan. Jaxston Chohan M, MD  

## 2020-02-07 ENCOUNTER — Telehealth: Payer: Self-pay | Admitting: Physician Assistant

## 2020-02-07 MED ORDER — SUTAB 1479-225-188 MG PO TABS: ORAL_TABLET | ORAL | 0 refills | Status: DC

## 2020-02-07 NOTE — Telephone Encounter (Signed)
Script sent to pharmacy.

## 2020-02-07 NOTE — Telephone Encounter (Signed)
Pt stated that [facility] did not receive Sutab rx.  Please resend.

## 2020-02-13 ENCOUNTER — Encounter: Payer: Self-pay | Admitting: Internal Medicine

## 2020-02-26 ENCOUNTER — Other Ambulatory Visit: Payer: Self-pay

## 2020-02-26 ENCOUNTER — Encounter: Payer: Self-pay | Admitting: Internal Medicine

## 2020-02-26 ENCOUNTER — Ambulatory Visit (AMBULATORY_SURGERY_CENTER): Payer: 59 | Admitting: Internal Medicine

## 2020-02-26 VITALS — BP 106/63 | HR 61 | Temp 96.9°F | Resp 12 | Ht 69.0 in | Wt 181.0 lb

## 2020-02-26 DIAGNOSIS — K648 Other hemorrhoids: Secondary | ICD-10-CM | POA: Diagnosis not present

## 2020-02-26 DIAGNOSIS — K625 Hemorrhage of anus and rectum: Secondary | ICD-10-CM

## 2020-02-26 DIAGNOSIS — K5730 Diverticulosis of large intestine without perforation or abscess without bleeding: Secondary | ICD-10-CM | POA: Diagnosis not present

## 2020-02-26 MED ORDER — SODIUM CHLORIDE 0.9 % IV SOLN: 500.0000 mL | Freq: Once | INTRAVENOUS | Status: DC

## 2020-02-26 MED ORDER — NYSTATIN-TRIAMCINOLONE 100000-0.1 UNIT/GM-% EX OINT: 1.0000 "application " | TOPICAL_OINTMENT | Freq: Two times a day (BID) | CUTANEOUS | 0 refills | Status: DC

## 2020-02-26 MED FILL — NYSTATIN-TRIAMCINOLONE OINT: 100000-0.1 | 5 days supply | Qty: 30 | Fill #0

## 2020-02-26 NOTE — Progress Notes (Signed)
V/S-CW  CHECK-IN-JB

## 2020-02-26 NOTE — Op Note (Signed)
[place] Patient Name: Richard Trujillo Procedure Date: 02/26/2020 10:07 AM MRN: [id number] Endoscopist: Jerene Bears , MD Age: 29 Referring MD:  Date of Birth: 01/20/1991 Gender: Male Account #: [id number] Procedure:                Colonoscopy Indications:              Rectal bleeding, fecal smearing Medicines:                Monitored Anesthesia Care Procedure:                Pre-Anesthesia Assessment:                           - Prior to the procedure, a History and Physical                            was performed, and patient medications and                            allergies were reviewed. The patient's tolerance of                            previous anesthesia was also reviewed. The risks                            and benefits of the procedure and the sedation                            options and risks were discussed with the patient.                            All questions were answered, and informed consent                            was obtained. Prior Anticoagulants: The patient has                            taken no previous anticoagulant or antiplatelet                            agents. ASA Grade Assessment: II - A patient with                            mild systemic disease. After reviewing the risks                            and benefits, the patient was deemed in                            satisfactory condition to undergo the procedure.                           After obtaining informed consent, the colonoscope  was passed under direct vision. Throughout the                            procedure, the patient's blood pressure, pulse, and                            oxygen saturations were monitored continuously. The                            Colonoscope was introduced through the anus and                            advanced to the terminal ileum. The colonoscopy was                            performed without difficulty.  The patient tolerated                            the procedure well. The quality of the bowel                            preparation was good. The terminal ileum, ileocecal                            valve, appendiceal orifice, and rectum were                            photographed. Scope In: 10:19:37 AM Scope Out: 10:29:27 AM Scope Withdrawal Time: 0 hours 8 minutes 27 seconds  Total Procedure Duration: 0 hours 9 minutes 50 seconds  Findings:                 The perianal exam findings include a perianal                            fungal rash.                           The terminal ileum appeared normal.                           A few small-mouthed diverticula were found in the                            sigmoid colon.                           Internal hemorrhoids were found during                            retroflexion. The hemorrhoids were small. Complications:            No immediate complications. Estimated Blood Loss:     Estimated blood loss: none. Impression:               - Perianal mild fungal rash found on perianal exam.                           -  The examined portion of the ileum was normal.                           - Minimal diverticulosis in the sigmoid colon.                           - Small internal hemorrhoids.                           - No specimens collected. Recommendation:           - Patient has a contact number available for                            emergencies. The signs and symptoms of potential                            delayed complications were discussed with the                            patient. Return to normal activities tomorrow.                            Written discharge instructions were provided to the                            patient.                           - Resume previous diet. Avoid beverages and foods                            known to worsen perianal itching (coffee, tea,                            cola, mild, alcohol,  chocolate, citrus fruits, Vit                            C supplements, tomatoes).                           - Continue present medications.                           - Begin Metamucil or Benefiber 1 tablespoon twice                            daily (this should help prevent fecal smearing).                           - Nystatin ointment 2 times daily to perianal skin                            for 5-7 days as needed for rash/itching.                           -  If rectal bleeding, fecal smearing continue                            hemorrhoidal banding can be considered.                           - Repeat colonoscopy for screening purposes at age                            46. Jerene Bears, MD 02/26/2020 10:36:16 AM This report has been signed electronically.

## 2020-02-26 NOTE — Patient Instructions (Signed)
Discharge instructions given. Handouts on diverticulosis and hemorrhoids. Prescription sent to pharmacy. Resume previous medications. YOU HAD AN ENDOSCOPIC PROCEDURE TODAY AT [facility]:   Refer to the procedure report that was given to you for any specific questions about what was found during the examination.  If the procedure report does not answer your questions, please call your gastroenterologist to clarify.  If you requested that your care partner not be given the details of your procedure findings, then the procedure report has been included in a sealed envelope for you to review at your convenience later.  YOU SHOULD EXPECT: Some feelings of bloating in the abdomen. Passage of more gas than usual.  Walking can help get rid of the air that was put into your GI tract during the procedure and reduce the bloating. If you had a lower endoscopy (such as a colonoscopy or flexible sigmoidoscopy) you may notice spotting of blood in your stool or on the toilet paper. If you underwent a bowel prep for your procedure, you may not have a normal bowel movement for a few days.  Please Note:  You might notice some irritation and congestion in your nose or some drainage.  This is from the oxygen used during your procedure.  There is no need for concern and it should clear up in a day or so.  SYMPTOMS TO REPORT IMMEDIATELY:   Following lower endoscopy (colonoscopy or flexible sigmoidoscopy):  Excessive amounts of blood in the stool  Significant tenderness or worsening of abdominal pains  Swelling of the abdomen that is new, acute  Fever of 100F or higher   For urgent or emergent issues, a gastroenterologist can be reached at any hour by calling [phone]. Do not use MyChart messaging for urgent concerns.    DIET:  We do recommend a small meal at first, but then you may proceed to your regular diet.  Drink plenty of fluids but you should avoid alcoholic beverages for 24  hours.  ACTIVITY:  You should plan to take it easy for the rest of today and you should NOT DRIVE or use heavy machinery until tomorrow (because of the sedation medicines used during the test).    FOLLOW UP: Our staff will call the number listed on your records 48-72 hours following your procedure to check on you and address any questions or concerns that you may have regarding the information given to you following your procedure. If we do not reach you, we will leave a message.  We will attempt to reach you two times.  During this call, we will ask if you have developed any symptoms of COVID 19. If you develop any symptoms (ie: fever, flu-like symptoms, shortness of breath, cough etc.) before then, please call [phone].  If you test positive for Covid 19 in the 2 weeks post procedure, please call and report this information to [place].    If any biopsies were taken you will be contacted by phone or by letter within the next 1-3 weeks.  Please call us at [phone] if you have not heard about the biopsies in 3 weeks.    SIGNATURES/CONFIDENTIALITY: You and/or your care partner have signed paperwork which will be entered into your electronic medical record.  These signatures attest to the fact that that the information above on your After Visit Summary has been reviewed and is understood.  Full responsibility of the confidentiality of this discharge information lies with you and/or your care-partner.

## 2020-02-26 NOTE — Progress Notes (Signed)
To pacu, vss. Report to [email or web address].TB

## 2020-02-28 ENCOUNTER — Telehealth: Payer: Self-pay

## 2020-02-28 NOTE — Telephone Encounter (Signed)
Covid-19 screening questions   Do you now or have you had a fever in the last 14 days? No.  Do you have any respiratory symptoms of shortness of breath or cough now or in the last 14 days? No.  Do you have any family members or close contacts with diagnosed or suspected Covid-19 in the past 14 days? No.  Have you been tested for Covid-19 and found to be positive? No.       Follow up Call-  Call back number 02/26/2020  Post procedure Call Back phone  # [phone]  Permission to leave phone message Yes  Some recent data might be hidden     Patient questions:  Do you have a fever, pain , or abdominal swelling? No. Pain Score  0 *  Have you tolerated food without any problems? Yes.    Have you been able to return to your normal activities? Yes.    Do you have any questions about your discharge instructions: Diet   No. Medications  No. Follow up visit  No.  Do you have questions or concerns about your Care? No.  Actions: * If pain score is 4 or above: No action needed, pain <4.

## 2020-05-04 ENCOUNTER — Telehealth: Payer: 59 | Admitting: Nurse Practitioner

## 2020-05-04 DIAGNOSIS — J020 Streptococcal pharyngitis: Secondary | ICD-10-CM | POA: Diagnosis not present

## 2020-05-04 MED ORDER — AMOXICILLIN 400 MG/5ML PO SUSR: ORAL | 0 refills | Status: DC

## 2020-05-04 NOTE — Addendum Note (Signed)
Addended by: Chevis Pretty on: 05/04/2020 09:35 AM   Modules accepted: Orders

## 2020-05-04 NOTE — Progress Notes (Signed)

## 2020-08-12 ENCOUNTER — Telehealth: Payer: Self-pay

## 2020-08-12 NOTE — Telephone Encounter (Signed)
Patient has been scheduled for 1st hemorrhoid banding with Dr. Hilarie Fredrickson on Monday, 09/21/2020 at 4 PM.   Spoke with patient and he is aware of the appointment and had no concerns at the end of the call.

## 2020-08-12 NOTE — Telephone Encounter (Signed)
-----   Message from Levin Erp, [place] sent at 08/12/2020 10:05 AM EST ----- Regarding: hemorrhoid banding Can you call patient and arrange an appointment for him with Dr. Hilarie Fredrickson for hemorrhoid banding given his continued fecal soiling.  Thank you so much.  Ellouise Newer, PA-C

## 2020-09-21 ENCOUNTER — Encounter: Payer: 59 | Admitting: Internal Medicine

## 2020-10-02 ENCOUNTER — Other Ambulatory Visit (HOSPITAL_COMMUNITY): Payer: Self-pay | Admitting: *Deleted

## 2020-10-02 MED FILL — HYDROCOD-APAP 7.5-325/15ML: 7.5-325 | 5 days supply | Qty: 200 | Fill #0

## 2020-10-02 MED FILL — PENICILLIN VK 250 MG/5 ML L: 250 | 7 days supply | Qty: 300 | Fill #0

## 2020-10-29 ENCOUNTER — Ambulatory Visit (INDEPENDENT_AMBULATORY_CARE_PROVIDER_SITE_OTHER): Payer: 59 | Admitting: Internal Medicine

## 2020-10-29 ENCOUNTER — Encounter: Payer: Self-pay | Admitting: Internal Medicine

## 2020-10-29 VITALS — BP 104/74 | HR 64 | Ht 68.75 in | Wt 176.2 lb

## 2020-10-29 DIAGNOSIS — K648 Other hemorrhoids: Secondary | ICD-10-CM | POA: Diagnosis not present

## 2020-10-29 DIAGNOSIS — R151 Fecal smearing: Secondary | ICD-10-CM

## 2020-10-29 DIAGNOSIS — K625 Hemorrhage of anus and rectum: Secondary | ICD-10-CM | POA: Diagnosis not present

## 2020-10-29 DIAGNOSIS — L290 Pruritus ani: Secondary | ICD-10-CM

## 2020-10-29 MED ORDER — NYSTATIN-TRIAMCINOLONE 100000-0.1 UNIT/GM-% EX OINT
1.0000 "application " | TOPICAL_OINTMENT | Freq: Two times a day (BID) | CUTANEOUS | 1 refills | Status: DC
  Filled 2020-11-02: qty 30, 15d supply, fill #0

## 2020-10-29 MED FILL — NYSTATIN-TRIAMCINOLONE 1000: 100000-0.1 | 3 days supply | Qty: 15 | Fill #0

## 2020-10-29 NOTE — Progress Notes (Signed)
   Subjective:    Patient ID: Richard Trujillo, male    DOB: [birth year], 30 y.o.   MRN: [id number]  HPI Richard Trujillo is a 30 year old male with a past medical history of symptomatic internal hemorrhoids, perianal tinea, intermittent fecal smearing who presents to discuss hemorrhoidal banding.  He is here alone today.  I performed a colonoscopy for him on 02/26/2020 to evaluate rectal bleeding and fecal smearing.  This revealed a perianal fungal rash which we treated with nystatin.  The exam was normal with the exception of a few diverticula in the sigmoid colon and internal hemorrhoids.  He has continued to have intermittent hemorrhoidal symptoms which for him include perianal soreness, fecal leakage/smearing, itching and intermittent rectal bleeding.  He has installed a bidet at home and this is helped significantly with his perianal symptoms.  He has a bowel movement on average about 4 days/week and his stools are formed.  Does not have hard stools or need to strain very often and there is no diarrhea.  No abdominal pain.  No upper GI hepatobiliary complaint   Review of Systems As per HPI, otherwise negative  Current Medications, Allergies, Past Medical History, Past Surgical History, Family History and Social History were reviewed in [employer] record.     Objective:   Physical Exam BP 104/74 (BP Location: Left Arm, Patient Position: Sitting, Cuff Size: Normal)   Pulse 64   Ht 5' 8.75" (1.746 m)   Wt 176 lb 4 oz (79.9 kg)   BMI 26.22 kg/m  Gen: awake, alert, NAD HEENT: anicteric  Rectal: Much improved perianal erythema, no external lesions, formed stool in the vault, no masses or tenderness Ext: no c/c/e Neuro: nonfocal      Assessment & Plan:  30 year old male with a past medical history of symptomatic internal hemorrhoids, perianal tinea, intermittent fecal smearing who presents to discuss hemorrhoidal banding.  1.  Symptomatic internal hemorrhoids --we  discussed hemorrhoidal banding.  He is a good candidate for banding and I expect improvement in symptoms.   PROCEDURE NOTE:  The patient presents with symptomatic grade 2 internal hemorrhoids, requesting rubber band ligation of his hemorrhoidal disease.  All risks, benefits and alternative forms of therapy were described and informed consent was obtained.   The anorectum was pre-medicated with 0.125% nitroglycerin ointment The decision was made to band the LL internal hemorrhoid, and the Pre[place] was used to perform band ligation without complication.   Digital anorectal examination was then performed to assure proper positioning of the band, and to adjust the banded tissue as required.  The patient was discharged home without pain or other issues.  Dietary and behavioral recommendations were given and along with follow-up instructions.     The patient will return as scheduled for follow-up and possible additional banding as required. No complications were encountered and the patient tolerated the procedure well.  2.  Fecal smearing --in part hemorrhoidal related but also would like to add Metamucil which should improve the symptom --Metamucil 1 heaping tablespoon daily  3.  Intermittent perianal dermatitis/tinea --nystatin ointment worked well, will refill today

## 2020-10-29 NOTE — Patient Instructions (Signed)
Your next hemorrhoid banding has been scheduled for 12/14/20 at 3:40.  Please continue your metamucil and use nystatin cream as needed.    HEMORRHOID BANDING PROCEDURE    FOLLOW-UP CARE   1. The procedure you have had should have been relatively painless since the banding of the area involved does not have nerve endings and there is no pain sensation.  The rubber band cuts off the blood supply to the hemorrhoid and the band may fall off as soon as 48 hours after the banding (the band may occasionally be seen in the toilet bowl following a bowel movement). You may notice a temporary feeling of fullness in the rectum which should respond adequately to plain Tylenol or Motrin.  2. Following the banding, avoid strenuous exercise that evening and resume full activity the next day.  A sitz bath (soaking in a warm tub) or bidet is soothing, and can be useful for cleansing the area after bowel movements.     3. To avoid constipation, take two tablespoons of natural wheat bran, natural oat bran, flax, Benefiber or any over the counter fiber supplement and increase your water intake to 7-8 glasses daily.    4. Unless you have been prescribed anorectal medication, do not put anything inside your rectum for two weeks: No suppositories, enemas, fingers, etc.  5. Occasionally, you may have more bleeding than usual after the banding procedure.  This is often from the untreated hemorrhoids rather than the treated one.  Don't be concerned if there is a tablespoon or so of blood.  If there is more blood than this, lie flat with your bottom higher than your head and apply an ice pack to the area. If the bleeding does not stop within a half an hour or if you feel faint, call our office at [phone] or go to the emergency room.  6. Problems are not common; however, if there is a substantial amount of bleeding, severe pain, chills, fever or difficulty passing urine (very rare) or other problems, you should  call us at [phone] or report to the nearest emergency room.  7. Do not stay seated continuously for more than 2-3 hours for a day or two after the procedure.  Tighten your buttock muscles 10-15 times every two hours and take 10-15 deep breaths every 1-2 hours.  Do not spend more than a few minutes on the toilet if you cannot empty your bowel; instead re-visit the toilet at a later time.    If you are age 29 or older, your body mass index should be between 23-30. Your Body mass index is 26.22 kg/m. If this is out of the aforementioned range listed, please consider follow up with your Primary Care Provider.  If you are age 48 or younger, your body mass index should be between 19-25. Your Body mass index is 26.22 kg/m. If this is out of the aformentioned range listed, please consider follow up with your Primary Care Provider.   Due to recent changes in healthcare laws, you may see the results of your imaging and laboratory studies on MyChart before your provider has had a chance to review them.  We understand that in some cases there may be results that are confusing or concerning to you. Not all laboratory results come back in the same time frame and the provider may be waiting for multiple results in order to interpret others.  Please give [place] 48 hours in order for your provider to thoroughly  review all the results before contacting the office for clarification of your results.

## 2020-11-02 ENCOUNTER — Other Ambulatory Visit (HOSPITAL_COMMUNITY): Payer: Self-pay

## 2020-11-02 ENCOUNTER — Other Ambulatory Visit (HOSPITAL_BASED_OUTPATIENT_CLINIC_OR_DEPARTMENT_OTHER): Payer: Self-pay

## 2020-11-06 ENCOUNTER — Other Ambulatory Visit (HOSPITAL_BASED_OUTPATIENT_CLINIC_OR_DEPARTMENT_OTHER): Payer: Self-pay

## 2020-11-17 ENCOUNTER — Telehealth: Payer: Self-pay | Admitting: Internal Medicine

## 2020-11-17 NOTE — Telephone Encounter (Signed)
Pt is seeing blood on the toilet tissue after BM. Discussed with him that this is not unusual that usually it is the hemorrhoids that have not been treated that the blood is coming from. Pt verbalized understanding.

## 2020-11-17 NOTE — Telephone Encounter (Signed)
Inbound call from patient stating every time he has a bowel movement he bleeds and wants to know if that is normal after hemorrhoid banding.  Please advise.

## 2020-11-20 ENCOUNTER — Encounter: Payer: 59 | Admitting: Family Medicine

## 2020-12-14 ENCOUNTER — Ambulatory Visit (INDEPENDENT_AMBULATORY_CARE_PROVIDER_SITE_OTHER): Payer: 59 | Admitting: Internal Medicine

## 2020-12-14 ENCOUNTER — Encounter: Payer: Self-pay | Admitting: Internal Medicine

## 2020-12-14 VITALS — BP 110/62 | HR 73 | Ht 69.0 in | Wt 182.0 lb

## 2020-12-14 DIAGNOSIS — K602 Anal fissure, unspecified: Secondary | ICD-10-CM | POA: Diagnosis not present

## 2020-12-14 DIAGNOSIS — K648 Other hemorrhoids: Secondary | ICD-10-CM | POA: Diagnosis not present

## 2020-12-14 MED ORDER — DILTIAZEM GEL 2 %: CUTANEOUS | 0 refills | Status: DC

## 2020-12-14 NOTE — Progress Notes (Signed)
   Subjective:    Patient ID: Richard Trujillo, male    DOB: 07-30-1991, 30 y.o.   MRN: [id number]  HPI Richard Trujillo is a 30 year old male with a history of symptomatic internal hemorrhoids, perianal tinea, intermittent fecal smearing who presents for follow-up.  He is here alone today.  He was last seen on 10/29/2020 when we performed hemorrhoidal banding to the left lateral internal hemorrhoid.  He reports that this has helped his hemorrhoidal symptoms tremendously which are now greater than 90% better.  Symptoms prior to the initial banding included.  No soreness, fecal leakage/smearing and intermittent rectal bleeding.  He has noticed for the last 4 weeks or so a sharp and burning type pain with passing stool.  This has had scant blood with wiping.  He has been more regular with the Metamucil which has helped with fecal smearing and with his regularity   Review of Systems As per HPI, otherwise negative  Current Medications, Allergies, Past Medical History, Past Surgical History, Family History and Social History were reviewed in [employer] record.     Objective:   Physical Exam BP 110/62   Pulse 73   Ht 5\' 9"  (1.753 m)   Wt 182 lb (82.6 kg)   SpO2 99%   BMI 26.88 kg/m  Gen: awake, alert, NAD HEENT: anicteric Neuro: nonfocal     Assessment & Plan:  30 year old male with a history of symptomatic internal hemorrhoids, perianal tinea, intermittent fecal smearing who presents for follow-up.  1.  Symptomatic internal hemorrhoids --he reports with 1 banding his symptoms are 90% better.  Given improvement he feels it is reasonable to wait for additional hemorrhoidal banding in the future should internal hemorrhoids become bothersome again. -- Banding x1 to the left lateral internal hemorrhoid; Future banding for symptomatic hemorrhoids as needed  2.  Anal fissure --he has symptoms consistent with anal fissure for the last 3 to 4 weeks.  We discussed this today.   I am going to treat with diltiazem and RectiCare -- Diltiazem gel 2% 2-3 times daily for 2 to 3 weeks until fissure heals -- RectiCare per box instruction for pain  3.  Fecal smearing --improved with hemorrhoidal banding x1.  Continue Metamucil 1 heaping tablespoon daily  4.  Perianal dermatitis/tinea --nystatin ointment as needed  Follow-up as needed  20 minutes total spent today including patient facing time, coordination of care, reviewing medical history/procedures/pertinent radiology studies, and documentation of the encounter.

## 2020-12-14 NOTE — Patient Instructions (Addendum)
If you are age 32 or older, your body mass index should be between 23-30. Your Body mass index is 26.88 kg/m. If this is out of the aforementioned range listed, please consider follow up with your Primary Care Provider.  If you are age 17 or younger, your body mass index should be between 19-25. Your Body mass index is 26.88 kg/m. If this is out of the aformentioned range listed, please consider follow up with your Primary Care Provider.   Continue Metamucil.  We have sent a prescription for Diltiazem 2% gel to [facility]. You should apply a pea size amount to your rectum 2 times daily as needed weeks.  [facility]'s information is below: Address: [address] [place], [place] [address]  [phone]  *Please DO NOT go directly from our office to pick up this medication! Give the pharmacy 1 day to process the prescription as this is compounded and takes time to make.  Please follow up as needed.  Thank you for entrusting me with your care and for choosing [facility], Dr. Zenovia Jarred

## 2020-12-22 ENCOUNTER — Other Ambulatory Visit (HOSPITAL_COMMUNITY): Payer: Self-pay

## 2020-12-22 ENCOUNTER — Ambulatory Visit (INDEPENDENT_AMBULATORY_CARE_PROVIDER_SITE_OTHER): Payer: 59 | Admitting: Family Medicine

## 2020-12-22 ENCOUNTER — Encounter: Payer: Self-pay | Admitting: Family Medicine

## 2020-12-22 ENCOUNTER — Other Ambulatory Visit: Payer: Self-pay

## 2020-12-22 VITALS — BP 118/82 | HR 63 | Temp 97.3°F | Ht 68.5 in | Wt 178.6 lb

## 2020-12-22 DIAGNOSIS — K219 Gastro-esophageal reflux disease without esophagitis: Secondary | ICD-10-CM

## 2020-12-22 DIAGNOSIS — Z8616 Personal history of COVID-19: Secondary | ICD-10-CM

## 2020-12-22 DIAGNOSIS — J301 Allergic rhinitis due to pollen: Secondary | ICD-10-CM | POA: Diagnosis not present

## 2020-12-22 DIAGNOSIS — J4520 Mild intermittent asthma, uncomplicated: Secondary | ICD-10-CM | POA: Diagnosis not present

## 2020-12-22 DIAGNOSIS — Z0000 Encounter for general adult medical examination without abnormal findings: Secondary | ICD-10-CM

## 2020-12-22 DIAGNOSIS — Z1159 Encounter for screening for other viral diseases: Secondary | ICD-10-CM

## 2020-12-22 DIAGNOSIS — K648 Other hemorrhoids: Secondary | ICD-10-CM | POA: Diagnosis not present

## 2020-12-22 MED ORDER — ALBUTEROL SULFATE HFA 108 (90 BASE) MCG/ACT IN AERS
2.0000 | INHALATION_SPRAY | Freq: Four times a day (QID) | RESPIRATORY_TRACT | 2 refills | Status: DC | PRN
  Filled 2020-12-22: qty 18, 25d supply, fill #0
  Filled 2020-12-22: qty 1, fill #0

## 2020-12-22 NOTE — Progress Notes (Signed)
   Subjective:    Patient ID: Richard Trujillo, male    DOB: [birth year], 30 y.o.   MRN: [id number]  HPI He is here for complete examination.  He has had difficulty with internal hemorrhoids and has been getting them banded.  This is helped a lot with his soiling that he has had trouble with.  He does have reflux disease and has this under good control with OTC meds on an as-needed basis.  He does have underlying allergies but rarely needs them treated.  He will occasionally have wheezing and would like a refill on his albuterol.  He has had 2 COVID vaccines and did have COVID in January.  He is now totally asymptomatic.  His marriage and work life is going quite well.  He has 2 children.  Otherwise family and social history as well as health maintenance and immunizations was reviewed.   Review of Systems  All other systems reviewed and are negative.      Objective:   Physical Exam Alert and in no distress. Tympanic membranes and canals are normal. Pharyngeal area is normal. Neck is supple without adenopathy or thyromegaly. Cardiac exam shows a regular sinus rhythm without murmurs or gallops. Lungs are clear to auscultation. Abdominal exam shows no masses or tenderness.  Lowella Fairy shows normal circumcised male.      Assessment & Plan:  Routine general medical examination at a health care facility - Plan: Lipid panel  History of COVID-19  Gastroesophageal reflux disease without esophagitis  Internal hemorrhoids  Seasonal allergic rhinitis due to pollen  Mild intermittent asthma without complication - Plan: albuterol (VENTOLIN HFA) 108 (90 Base) MCG/ACT inhaler  Need for hepatitis C screening test - Plan: Hepatitis C antibody His weight is going up gradually.  I discussed diet and exercise with him especially cutting back on carbohydrates. Discussed the treatment of allergies and since he is really not having any major difficulties no intervention needed.  I will renew his albuterol.   Continue to treat to reflux as needed.

## 2020-12-23 LAB — LIPID PANEL
Chol/HDL Ratio: 4.5 ratio (ref 0.0–5.0)
Cholesterol, Total: 150 mg/dL (ref 100–199)
HDL: 33 mg/dL — ABNORMAL LOW (ref >39)
LDL Chol Calc (NIH): 85 mg/dL (ref 0–99)
Triglycerides: 188 mg/dL — ABNORMAL HIGH (ref 0–149)
VLDL Cholesterol Cal: 32 mg/dL (ref 5–40)

## 2020-12-23 LAB — HEPATITIS C ANTIBODY: Hep C Virus Ab: <0.1 s/co ratio (ref 0.0–0.9)

## 2021-02-12 ENCOUNTER — Other Ambulatory Visit (HOSPITAL_BASED_OUTPATIENT_CLINIC_OR_DEPARTMENT_OTHER): Payer: Self-pay

## 2021-02-12 ENCOUNTER — Other Ambulatory Visit: Payer: Self-pay

## 2021-02-12 ENCOUNTER — Ambulatory Visit: Payer: 59 | Attending: Internal Medicine

## 2021-02-12 DIAGNOSIS — Z23 Encounter for immunization: Secondary | ICD-10-CM

## 2021-02-12 MED ORDER — COVID-19 AT HOME ANTIGEN TEST VI KIT
PACK | 0 refills | Status: DC
  Filled 2021-02-12: qty 4, 8d supply, fill #0

## 2021-02-12 MED ORDER — PFIZER-BIONT COVID-19 VAC-TRIS 30 MCG/0.3ML IM SUSP
INTRAMUSCULAR | 0 refills | Status: DC
  Filled 2021-02-12: qty 0.3, 1d supply, fill #0

## 2021-02-12 NOTE — Progress Notes (Signed)
   Covid-19 Vaccination Clinic  Name:  Richard Trujillo    MRN: [id number] DOB: [birth year]  02/12/2021  Mr. Frede was observed post Covid-19 immunization for 15 minutes without incident. He was provided with Vaccine Information Sheet and instruction to access the V-Safe system.   Mr. Bierlein was instructed to call 911 with any severe reactions post vaccine: Difficulty breathing  Swelling of face and throat  A fast heartbeat  A bad rash all over body  Dizziness and weakness   Immunizations Administered     Name Date Dose VIS Date Route   PFIZER Comrnaty(Gray TOP) Covid-19 Vaccine 02/12/2021  9:27 AM 0.3 mL 08/06/2020 Intramuscular   Manufacturer: [place]   Lot: [id number]   [place]: [phone]

## 2021-02-22 ENCOUNTER — Emergency Department (HOSPITAL_BASED_OUTPATIENT_CLINIC_OR_DEPARTMENT_OTHER): Payer: 59 | Admitting: Radiology

## 2021-02-22 ENCOUNTER — Emergency Department (HOSPITAL_BASED_OUTPATIENT_CLINIC_OR_DEPARTMENT_OTHER)
Admission: EM | Admit: 2021-02-22 | Discharge: 2021-02-22 | Disposition: A | Discharge status: Home / Self Care | Payer: 59 | Attending: Emergency Medicine | Admitting: Emergency Medicine

## 2021-02-22 ENCOUNTER — Other Ambulatory Visit: Payer: Self-pay

## 2021-02-22 DIAGNOSIS — R079 Chest pain, unspecified: Secondary | ICD-10-CM | POA: Insufficient documentation

## 2021-02-22 DIAGNOSIS — R1013 Epigastric pain: Secondary | ICD-10-CM | POA: Diagnosis not present

## 2021-02-22 DIAGNOSIS — J4520 Mild intermittent asthma, uncomplicated: Secondary | ICD-10-CM | POA: Insufficient documentation

## 2021-02-22 DIAGNOSIS — Z8616 Personal history of COVID-19: Secondary | ICD-10-CM | POA: Insufficient documentation

## 2021-02-22 LAB — BASIC METABOLIC PANEL
Anion gap: 8 (ref 5–15)
Anion gap: 10 (ref 5–15)
BUN: 6 mg/dL (ref 6–20)
BUN: 9 mg/dL (ref 6–20)
CO2: 17 mmol/L — ABNORMAL LOW (ref 22–32)
CO2: 25 mmol/L (ref 22–32)
Calcium: 5.8 mg/dL — CL (ref 8.9–10.3)
Calcium: 9 mg/dL (ref 8.9–10.3)
Chloride: 119 mmol/L — ABNORMAL HIGH (ref 98–111)
Chloride: 105 mmol/L (ref 98–111)
Creatinine, Ser: 0.51 mg/dL — ABNORMAL LOW (ref 0.61–1.24)
Creatinine, Ser: 0.94 mg/dL (ref 0.61–1.24)
GFR, Estimated: >60 mL/min (ref >60)
GFR, Estimated: >60 mL/min (ref >60)
Glucose, Bld: 75 mg/dL (ref 70–99)
Glucose, Bld: 106 mg/dL — ABNORMAL HIGH (ref 70–99)
Potassium: 2.1 mmol/L — CL (ref 3.5–5.1)
Potassium: 3.8 mmol/L (ref 3.5–5.1)
Sodium: 144 mmol/L (ref 135–145)
Sodium: 140 mmol/L (ref 135–145)

## 2021-02-22 LAB — CBC
HCT: 44.4 % (ref 39.0–52.0)
Hemoglobin: 15 g/dL (ref 13.0–17.0)
MCH: 29.3 pg (ref 26.0–34.0)
MCHC: 33.8 g/dL (ref 30.0–36.0)
MCV: 86.7 fL (ref 80.0–100.0)
Platelets: 421 10*3/uL — ABNORMAL HIGH (ref 150–400)
RBC: 5.12 MIL/uL (ref 4.22–5.81)
RDW: 12.8 % (ref 11.5–15.5)
WBC: 10 10*3/uL (ref 4.0–10.5)
nRBC: 0 % (ref 0.0–0.2)

## 2021-02-22 LAB — TROPONIN I (HIGH SENSITIVITY)
Troponin I (High Sensitivity): 2 ng/L (ref <18)
Troponin I (High Sensitivity): 2 ng/L (ref <18)

## 2021-02-22 NOTE — ED Notes (Signed)
MD [place] made aware of Critical Lab Results. MD would like to have Specimens re-collected prior to intervention.

## 2021-02-22 NOTE — ED Triage Notes (Signed)
Pt states he was standing about 1700 today and had sudden onset of epigastric pain toward the left.  States it is like his GERD pain but that is usually in the center not left.  Hurts worse with deep breath.  No n/v/d or shob.

## 2021-02-22 NOTE — Discharge Instructions (Addendum)
Call your primary care doctor or specialist as discussed in the next 2-3 days.   Return immediately back to the ER if:  Your symptoms worsen within the next 12-24 hours. You develop new symptoms such as new fevers, persistent vomiting, new pain, shortness of breath, or new weakness or numbness, or if you have any other concerns.  

## 2021-02-22 NOTE — ED Provider Notes (Signed)
[facility] DEPT Provider Note   CSN: [id number] Arrival date & time: 02/22/21  1836     History Chief Complaint  Patient presents with   Chest Pain    Richard Trujillo is a 30 y.o. male.  Zentz with left upper quadrant/left lower chest pain.  Symptoms began earlier today during the midday when he was standing felt sharp pain in that region.  Did not radiate anywhere but when he took a deep breath the pain felt worse.  Pain lasted for about 2 hours and then has since resolved.  Currently denies any pain or discomfort.  No associated shortness of breath or palpitations or diaphoresis.  No prior cardiac history.  Does have a history of reflux but states that this feels slightly different from his regular reflux.      Past Medical History:  Diagnosis Date   Gastritis    GERD (gastroesophageal reflux disease)    Hiatal hernia    History of EMG 01/20/12   EMG/NCS   Neck pain    PT and neurology consults 12/2011    Patient Active Problem List   Diagnosis Date Noted   Mild intermittent asthma without complication 48/27/0786   Seasonal allergic rhinitis due to pollen 12/22/2020   Internal hemorrhoids 12/22/2020   History of COVID-19 12/22/2020   GERD (gastroesophageal reflux disease) 09/24/2018    Past Surgical History:  Procedure Laterality Date   ESOPHAGOGASTRODUODENOSCOPY  10/24/11   Dr. Hilarie Fredrickson       Family History  Problem Relation Age of Onset   Heart disease Paternal Grandmother    Cancer Paternal Aunt    Colon cancer Neg Hx    Colon polyps Neg Hx    Esophageal cancer Neg Hx    Rectal cancer Neg Hx    Stomach cancer Neg Hx     Social History   Tobacco Use   Smoking status: Never   Smokeless tobacco: Never  Vaping Use   Vaping Use: Never used  Substance Use Topics   Alcohol use: No    Alcohol/week: 0.0 standard drinks   Drug use: No    Home Medications Prior to Admission medications   Medication Sig Start Date End Date  Taking? Authorizing Provider  acetaminophen (TYLENOL) 500 MG tablet Take 500-1,000 mg by mouth as needed.    [provider]  albuterol (VENTOLIN HFA) 108 (90 Base) MCG/ACT inhaler Inhale 2 puffs into the lungs every 6 (six) hours as needed for wheezing or shortness of breath. 12/22/20   Denita Lung, MD  calcium carbonate (TUMS - DOSED IN MG ELEMENTAL CALCIUM) 500 MG chewable tablet Chew 1 tablet by mouth as needed for indigestion or heartburn.    [provider]  COVID-19 At Home Antigen Test KIT Use as directed 02/12/21   Margie Ege, RPH  COVID-19 mRNA Vac-TriS, Pfizer, (PFIZER-BIONT COVID-19 VAC-TRIS) SUSP injection Inject into the muscle. 02/12/21   Carlyle Basques, MD  diltiazem 2 % GEL Diltiazem 2% gel Apply a pea sized amount internally 2 times daily Dispense 30 GM zero refill 12/14/20   Pyrtle, Lajuan Lines, MD  HYDROcodone-acetaminophen (HYCET) 7.5-325 mg/15 ml solution TAKE 10 ML BY MOUTH EVERY 6 HOURS AS NEEDED FOR PAIN Patient not taking: Reported on 12/22/2020 10/02/20 03/31/21  Margarette Canada  nystatin-triamcinolone ointment [facility]) Apply 1 application topically 2 times daily to perianal skin for 5-7 days as needed for rash/itching. 10/29/20   Pyrtle, Lajuan Lines, MD    Allergies    Naproxen,  Prilosec [omeprazole magnesium], and Pseudoephedrine  Review of Systems   Review of Systems  Constitutional:  Negative for fever.  HENT:  Negative for ear pain and sore throat.   Eyes:  Negative for pain.  Respiratory:  Negative for cough.   Cardiovascular:  Positive for chest pain.  Gastrointestinal:  Negative for abdominal pain.  Genitourinary:  Negative for flank pain.  Musculoskeletal:  Negative for back pain.  Skin:  Negative for color change and rash.  Neurological:  Negative for syncope.  All other systems reviewed and are negative.  Physical Exam Updated Vital Signs BP 123/90   Pulse 79   Temp 98.4 F (36.9 C)   Resp 18   Ht '5\' 9"'  (1.753 m)   Wt 80.7 kg   SpO2 100%    BMI 26.29 kg/m   Physical Exam Constitutional:      General: He is not in acute distress.    Appearance: He is well-developed.  HENT:     Head: Normocephalic.     Nose: Nose normal.  Eyes:     Extraocular Movements: Extraocular movements intact.  Cardiovascular:     Rate and Rhythm: Normal rate.  Pulmonary:     Effort: Pulmonary effort is normal.  Abdominal:     General: There is no distension.     Tenderness: There is no abdominal tenderness. There is no guarding or rebound.  Skin:    Coloration: Skin is not jaundiced.  Neurological:     Mental Status: He is alert. Mental status is at baseline.    ED Results / Procedures / Treatments   Labs (all labs ordered are listed, but only abnormal results are displayed) Labs Reviewed  BASIC METABOLIC PANEL - Abnormal; Notable for the following components:      Result Value   Potassium 2.1 (*)    Chloride 119 (*)    CO2 17 (*)    Creatinine, Ser 0.51 (*)    Calcium 5.8 (*)    All other components within normal limits  CBC - Abnormal; Notable for the following components:   Platelets 421 (*)    All other components within normal limits  BASIC METABOLIC PANEL - Abnormal; Notable for the following components:   Glucose, Bld 106 (*)    All other components within normal limits  TROPONIN I (HIGH SENSITIVITY)  TROPONIN I (HIGH SENSITIVITY)    EKG EKG Interpretation  Date/Time:  Monday February 22 2021 18:45:07 EDT Ventricular Rate:  88 PR Interval:  156 QRS Duration: 96 QT Interval:  356 QTC Calculation: 430 R Axis:   52 Text Interpretation: Normal sinus rhythm Normal ECG Confirmed by Thamas Jaegers ([id number]) on 02/22/2021 7:47:42 PM  Radiology DG Chest 2 View  Result Date: 02/22/2021 CLINICAL DATA:  Epigastric pain. EXAM: CHEST - 2 VIEW COMPARISON:  July 18, 2018 FINDINGS: The heart size and mediastinal contours are within normal limits. Both lungs are clear. The visualized skeletal structures are unremarkable. IMPRESSION:  No active cardiopulmonary disease. Electronically Signed   By: Virgina Norfolk M.D.   On: 02/22/2021 19:23    Procedures Procedures   Medications Ordered in ED Medications - No data to display  ED Course  I have reviewed the triage vital signs and the nursing notes.  Pertinent labs & imaging results that were available during my care of the patient were reviewed by me and considered in my medical decision making (see chart for details).    MDM Rules/Calculators/A&P  EKG is unremarkable sinus rhythm normal rate no ST elevations or ST depressions noted.  Initial troponin is negative.  Labs otherwise appeared abnormal, suspected that sample and had a redraw which was appropriate.  Patient remains asymptomatic during his ER stay.  I doubt acute coronary syndrome given work-up today, will advise outpatient follow-up with cardiology within 3 to 4 days, advising immediate return for recurrent or worsening symptoms or any additional concerns.  Final Clinical Impression(s) / ED Diagnoses Final diagnoses:  Chest pain, unspecified type    Rx / DC Orders ED Discharge Orders     None        Luna Fuse, MD 02/22/21 2112

## 2021-04-27 ENCOUNTER — Other Ambulatory Visit (HOSPITAL_BASED_OUTPATIENT_CLINIC_OR_DEPARTMENT_OTHER): Payer: Self-pay

## 2021-04-27 MED ORDER — COVID-19 AT HOME ANTIGEN TEST VI KIT
PACK | 0 refills | Status: DC
  Filled 2021-04-27: qty 2, 4d supply, fill #0

## 2022-01-03 ENCOUNTER — Ambulatory Visit (INDEPENDENT_AMBULATORY_CARE_PROVIDER_SITE_OTHER): Payer: 59 | Admitting: Family Medicine

## 2022-01-03 ENCOUNTER — Encounter: Payer: Self-pay | Admitting: Family Medicine

## 2022-01-03 VITALS — BP 108/68 | HR 56 | Temp 96.8°F | Ht 68.5 in | Wt 180.2 lb

## 2022-01-03 DIAGNOSIS — R151 Fecal smearing: Secondary | ICD-10-CM | POA: Diagnosis not present

## 2022-01-03 DIAGNOSIS — J301 Allergic rhinitis due to pollen: Secondary | ICD-10-CM | POA: Diagnosis not present

## 2022-01-03 DIAGNOSIS — J4520 Mild intermittent asthma, uncomplicated: Secondary | ICD-10-CM

## 2022-01-03 DIAGNOSIS — K648 Other hemorrhoids: Secondary | ICD-10-CM | POA: Diagnosis not present

## 2022-01-03 DIAGNOSIS — K219 Gastro-esophageal reflux disease without esophagitis: Secondary | ICD-10-CM | POA: Diagnosis not present

## 2022-01-03 DIAGNOSIS — Z833 Family history of diabetes mellitus: Secondary | ICD-10-CM | POA: Insufficient documentation

## 2022-01-03 DIAGNOSIS — Z0000 Encounter for general adult medical examination without abnormal findings: Secondary | ICD-10-CM

## 2022-01-03 NOTE — Progress Notes (Signed)
? ?  Subjective:  ? ? Patient ID: Richard Trujillo, male    DOB: 1991-03-17, 31 y.o.   MRN: [id number] ? ?HPI ?He is here for complete examination.  He has no particular concerns or complaints.  His allergies are under good control with as needed antihistamines.  He does have an inhaler at home but rarely uses it for rare bouts of asthma.  His reflux seems to be under good control.  He still has difficulty with soiling and has seen GI in the past for this.  He has been adding bulk to his diet but so far has not been very useful.  He still has internal hemorrhoids and apparently GI is considering having them removed to see if that would help with his soiling.  He now states that his father has diabetes.  He notes that his weight has been slowly increasing.  Otherwise his family and social history as well as health maintenance and immunizations was reviewed. ? ? ?Review of Systems  ?All other systems reviewed and are negative. ? ?   ?Objective:  ? Physical Exam ?Alert and in no distress. Tympanic membranes and canals are normal. Pharyngeal area is normal. Neck is supple without adenopathy or thyromegaly. Cardiac exam shows a regular sinus rhythm without murmurs or gallops. Lungs are clear to auscultation. ?Abdominal exam shows no masses or tenderness.  Genitalia normal circumcised male ? ? ? ?   ?Assessment & Plan:  ?Routine general medical examination at a health care facility - Plan: CBC with Differential/Platelet, Comprehensive metabolic panel, Lipid panel ? ?Family history of diabetes mellitus - Plan: Comprehensive metabolic panel ? ?Gastroesophageal reflux disease without esophagitis - Plan: CBC with Differential/Platelet, Comprehensive metabolic panel ? ?Seasonal allergic rhinitis due to pollen ? ?Internal hemorrhoids ? ?Mild intermittent asthma without complication ? ?Soiling ?Discussed weight loss with him and encouraged him to cut back on white food.  Bread, rice, pasta, potatoes and sugar.  Cut these portions in  half.  20 minutes of something physical or 150 minutes a week of something ?Lookup Kegel maneuvers see if this could help with his swelling. ? ?

## 2022-01-03 NOTE — Patient Instructions (Addendum)
Cut back on white food.  Bread, rice, pasta, potatoes and sugar.  Cut these portions in half.  20 minutes of something physical or 150 minutes a week of something ?Lookup Kegel maneuvers ?

## 2022-01-04 LAB — CBC WITH DIFFERENTIAL/PLATELET
Basophils Absolute: 0.1 10*3/uL (ref 0.0–0.2)
Basos: 1 %
EOS (ABSOLUTE): 0.4 10*3/uL (ref 0.0–0.4)
Eos: 5 %
Hematocrit: 45.1 % (ref 37.5–51.0)
Hemoglobin: 15.3 g/dL (ref 13.0–17.7)
Immature Grans (Abs): 0 10*3/uL (ref 0.0–0.1)
Immature Granulocytes: 0 %
Lymphocytes Absolute: 2.1 10*3/uL (ref 0.7–3.1)
Lymphs: 30 %
MCH: 29.1 pg (ref 26.6–33.0)
MCHC: 33.9 g/dL (ref 31.5–35.7)
MCV: 86 fL (ref 79–97)
Monocytes Absolute: 0.7 10*3/uL (ref 0.1–0.9)
Monocytes: 9 %
Neutrophils Absolute: 3.8 10*3/uL (ref 1.4–7.0)
Neutrophils: 55 %
Platelets: 334 10*3/uL (ref 150–450)
RBC: 5.25 x10E6/uL (ref 4.14–5.80)
RDW: 12.1 % (ref 11.6–15.4)
WBC: 7 10*3/uL (ref 3.4–10.8)

## 2022-01-04 LAB — COMPREHENSIVE METABOLIC PANEL
ALT: 30 IU/L (ref 0–44)
AST: 19 IU/L (ref 0–40)
Albumin/Globulin Ratio: 1.5 (ref 1.2–2.2)
Albumin: 4.8 g/dL (ref 4.0–5.0)
Alkaline Phosphatase: 85 IU/L (ref 44–121)
BUN/Creatinine Ratio: 9 (ref 9–20)
BUN: 9 mg/dL (ref 6–20)
Bilirubin Total: 0.5 mg/dL (ref 0.0–1.2)
CO2: 23 mmol/L (ref 20–29)
Calcium: 10.1 mg/dL (ref 8.7–10.2)
Chloride: 102 mmol/L (ref 96–106)
Creatinine, Ser: 1.01 mg/dL (ref 0.76–1.27)
Globulin, Total: 3.1 g/dL (ref 1.5–4.5)
Glucose: 84 mg/dL (ref 70–99)
Potassium: 4.4 mmol/L (ref 3.5–5.2)
Sodium: 142 mmol/L (ref 134–144)
Total Protein: 7.9 g/dL (ref 6.0–8.5)
eGFR: 101 mL/min/{1.73_m2} (ref >59)

## 2022-01-04 LAB — LIPID PANEL
Chol/HDL Ratio: 4.1 ratio (ref 0.0–5.0)
Cholesterol, Total: 149 mg/dL (ref 100–199)
HDL: 36 mg/dL — ABNORMAL LOW (ref >39)
LDL Chol Calc (NIH): 96 mg/dL (ref 0–99)
Triglycerides: 88 mg/dL (ref 0–149)
VLDL Cholesterol Cal: 17 mg/dL (ref 5–40)

## 2022-03-23 ENCOUNTER — Encounter: Payer: Self-pay | Admitting: Medical

## 2022-03-23 ENCOUNTER — Ambulatory Visit: Payer: 59 | Admitting: Medical

## 2022-03-23 ENCOUNTER — Other Ambulatory Visit (HOSPITAL_BASED_OUTPATIENT_CLINIC_OR_DEPARTMENT_OTHER): Payer: Self-pay

## 2022-03-23 VITALS — BP 110/70 | HR 83 | Temp 98.8°F | Wt 181.8 lb

## 2022-03-23 DIAGNOSIS — J069 Acute upper respiratory infection, unspecified: Secondary | ICD-10-CM

## 2022-03-23 DIAGNOSIS — J4520 Mild intermittent asthma, uncomplicated: Secondary | ICD-10-CM

## 2022-03-23 MED ORDER — HYDROCOD POLI-CHLORPHE POLI ER 10-8 MG/5ML PO SUER
5.0000 mL | Freq: Two times a day (BID) | ORAL | 0 refills | Status: DC
  Filled 2022-03-23 (×2): qty 140, 14d supply, fill #0

## 2022-03-23 MED ORDER — ALBUTEROL SULFATE HFA 108 (90 BASE) MCG/ACT IN AERS
2.0000 | INHALATION_SPRAY | Freq: Four times a day (QID) | RESPIRATORY_TRACT | 1 refills | Status: AC | PRN
  Filled 2022-03-23 (×2): qty 18, 25d supply, fill #0

## 2022-03-23 MED ORDER — AZITHROMYCIN 250 MG PO TABS
ORAL_TABLET | ORAL | 0 refills | Status: DC
  Filled 2022-03-23 (×2): qty 6, 5d supply, fill #0

## 2022-03-23 NOTE — Progress Notes (Signed)
Subjective:  Richard Trujillo is a 31 y.o. male who presents for Chief Complaint  Patient presents with   Acute Visit    Upper respiratory symptoms since last week. Coughing and congestion. Covid test negative as of Monday.      A week ago started with sore throat.  But within 2 days sore throat resolved.  Started getting cough, some ear discomfort.   Lost voice that weekend.  In last few days cough has been a little more frequency but initially dry cough.  Has gotten some wet cough in recent days.  No longer has sore throat.  No fever, no body aches or chills. No NVD.  No sick contacts. Using mucinex OTC. No SOB or wheezing.  He has a history of possibly mild asthma.  He notes that in the past he has had bronchitis where the cough can linger for months.  He wanted to catch this before it got worse . No other aggravating or relieving factors.    No other c/o.  Past Medical History:  Diagnosis Date   Gastritis    GERD (gastroesophageal reflux disease)    Hiatal hernia    History of EMG 01/20/12   EMG/NCS   Neck pain    PT and neurology consults 12/2011   Current Outpatient Medications on File Prior to Visit  Medication Sig Dispense Refill   calcium carbonate (TUMS - DOSED IN MG ELEMENTAL CALCIUM) 500 MG chewable tablet Chew 1 tablet by mouth as needed for indigestion or heartburn.     acetaminophen (TYLENOL) 500 MG tablet Take 500-1,000 mg by mouth as needed. (Patient not taking: Reported on 03/23/2022)     No current facility-administered medications on file prior to visit.     The following portions of the patient's history were reviewed and updated as appropriate: allergies, current medications, past family history, past medical history, past social history, past surgical history and problem list.  ROS Otherwise as in subjective above  Objective: BP 110/70   Pulse 83   Temp 98.8 F (37.1 C)   Wt 181 lb 12.8 oz (82.5 kg)   SpO2 99%   BMI 27.24 kg/m   General appearance:  alert, no distress, well developed, well nourished HEENT: normocephalic, sclerae anicteric, conjunctiva pink and moist, right TM with mild erythema, left TM normal nares patent, no discharge or erythema, pharynx normal Oral cavity: MMM, no lesions Neck: supple, no lymphadenopathy, no thyromegaly, no masses Heart: RRR, normal S1, S2, no murmurs Lungs: Somewhat bronchial sounding airway but otherwise clear , no wheezes, rhonchi, or rales Pulses: 2+ radial pulses, 2+ pedal pulses, normal cap refill Ext: no edema   Assessment: Encounter Diagnoses  Name Primary?   Upper respiratory tract infection, unspecified type Yes   Mild intermittent asthma without complication      Plan: We discussed symptoms and concerns.  This may just be a viral respiratory tract infection with some reactive airway.  Gave him a Z-Pak for watch and wait approach in the event he gets more wet rattly cough more productive phlegm fever, or worse ear pain in next 72 hours.  Currently at I asked him not to take this yet.  He will begin Tussionex and albuterol.  He will do the albuterol at least twice a day for the next several days.  Continue good hydration and rest.  If worsening symptoms call or recheck.  Advised that he plan to do a updated baseline PFT in the near future  Joachim was seen  today for acute visit.  Diagnoses and all orders for this visit:  Upper respiratory tract infection, unspecified type  Mild intermittent asthma without complication -     albuterol (VENTOLIN HFA) 108 (90 Base) MCG/ACT inhaler; Inhale 2 puffs into the lungs every 6 (six) hours as needed for wheezing or shortness of breath.  Other orders -     chlorpheniramine-HYDROcodone (TUSSIONEX PENNKINETIC ER) 10-8 MG/5ML; Take 5 mLs by mouth 2 (two) times daily. -     azithromycin (ZITHROMAX) 250 MG tablet; Take 2 tablets by mouth on day 1, then 1 tablet by mouth daily for days 2-4    Follow up: As needed

## 2022-05-04 ENCOUNTER — Encounter: Payer: Self-pay | Admitting: Internal Medicine

## 2022-06-01 ENCOUNTER — Ambulatory Visit: Payer: 59 | Admitting: Family Medicine

## 2022-06-07 ENCOUNTER — Encounter: Payer: Self-pay | Admitting: Internal Medicine

## 2022-06-20 ENCOUNTER — Encounter: Payer: Self-pay | Admitting: Internal Medicine

## 2022-11-23 ENCOUNTER — Other Ambulatory Visit (HOSPITAL_BASED_OUTPATIENT_CLINIC_OR_DEPARTMENT_OTHER): Payer: Self-pay

## 2022-11-23 ENCOUNTER — Encounter: Payer: Self-pay | Admitting: Family Medicine

## 2022-11-23 ENCOUNTER — Ambulatory Visit: Payer: 59 | Admitting: Family Medicine

## 2022-11-23 VITALS — BP 118/78 | HR 81 | Temp 98.0°F | Resp 16 | Wt 185.4 lb

## 2022-11-23 DIAGNOSIS — N410 Acute prostatitis: Secondary | ICD-10-CM | POA: Diagnosis not present

## 2022-11-23 LAB — POCT URINALYSIS DIP (PROADVANTAGE DEVICE)
Bilirubin, UA: NEGATIVE
Blood, UA: NEGATIVE
Glucose, UA: NEGATIVE mg/dL
Ketones, POC UA: NEGATIVE mg/dL
Nitrite, UA: NEGATIVE
Protein Ur, POC: NEGATIVE mg/dL
Specific Gravity, Urine: 1.01
Urobilinogen, Ur: 0.2
pH, UA: 7 (ref 5.0–8.0)

## 2022-11-23 MED ORDER — SULFAMETHOXAZOLE-TRIMETHOPRIM 800-160 MG PO TABS
1.0000 | ORAL_TABLET | Freq: Two times a day (BID) | ORAL | 0 refills | Status: DC
  Filled 2022-11-23: qty 28, 14d supply, fill #0

## 2022-11-23 NOTE — Progress Notes (Signed)
   Subjective:    Patient ID: Richard Trujillo, male    DOB: 1991-05-16, 32 y.o.   MRN: [id number]  HPI He complains of a 2-week history of dull left-sided testicular discomfort that occasionally becomes sharp.  No discharge or dysuria.  No extramarital sexual activity.  No abdominal or rectal discomfort.  No trouble with sexual activity.   Review of Systems     Objective:   Physical Exam Genital exam shows a normal penis and testes.  Rectal exam does show a tender boggy prostate.       Assessment & Plan:  Acute prostatitis - Plan: sulfamethoxazole-trimethoprim (BACTRIM DS) 800-160 MG tablet, POCT Urinalysis DIP (Proadvantage Device) I reassured him that this was not unusual.  I will treat him for 2 weeks and if he does not improve, further evaluation and treatment might be necessary.

## 2022-12-19 ENCOUNTER — Encounter: Payer: Self-pay | Admitting: Family Medicine

## 2022-12-20 ENCOUNTER — Other Ambulatory Visit (HOSPITAL_BASED_OUTPATIENT_CLINIC_OR_DEPARTMENT_OTHER): Payer: Self-pay

## 2022-12-20 ENCOUNTER — Other Ambulatory Visit (HOSPITAL_COMMUNITY): Payer: Self-pay

## 2022-12-20 ENCOUNTER — Other Ambulatory Visit: Payer: Self-pay | Admitting: Family Medicine

## 2022-12-20 MED ORDER — CIPROFLOXACIN HCL 500 MG PO TABS
500.0000 mg | ORAL_TABLET | Freq: Two times a day (BID) | ORAL | 0 refills | Status: AC
  Filled 2022-12-20 (×2): qty 20, 10d supply, fill #0

## 2022-12-21 ENCOUNTER — Other Ambulatory Visit (HOSPITAL_COMMUNITY): Payer: Self-pay

## 2023-01-11 ENCOUNTER — Encounter: Payer: 59 | Admitting: Family Medicine

## 2023-01-31 ENCOUNTER — Encounter: Payer: 59 | Admitting: Family Medicine

## 2023-01-31 DIAGNOSIS — K219 Gastro-esophageal reflux disease without esophagitis: Secondary | ICD-10-CM

## 2023-01-31 DIAGNOSIS — J4520 Mild intermittent asthma, uncomplicated: Secondary | ICD-10-CM

## 2023-01-31 DIAGNOSIS — Z0000 Encounter for general adult medical examination without abnormal findings: Secondary | ICD-10-CM

## 2023-01-31 DIAGNOSIS — Z833 Family history of diabetes mellitus: Secondary | ICD-10-CM

## 2023-01-31 DIAGNOSIS — J301 Allergic rhinitis due to pollen: Secondary | ICD-10-CM

## 2023-02-07 IMAGING — DX DG CHEST 2V
2 series · 2 of 2 positions shown · non-contrast
Comparison: July 18, 2018

CLINICAL DATA: Epigastric pain.

EXAM:
CHEST - 2 VIEW

[chest pa]
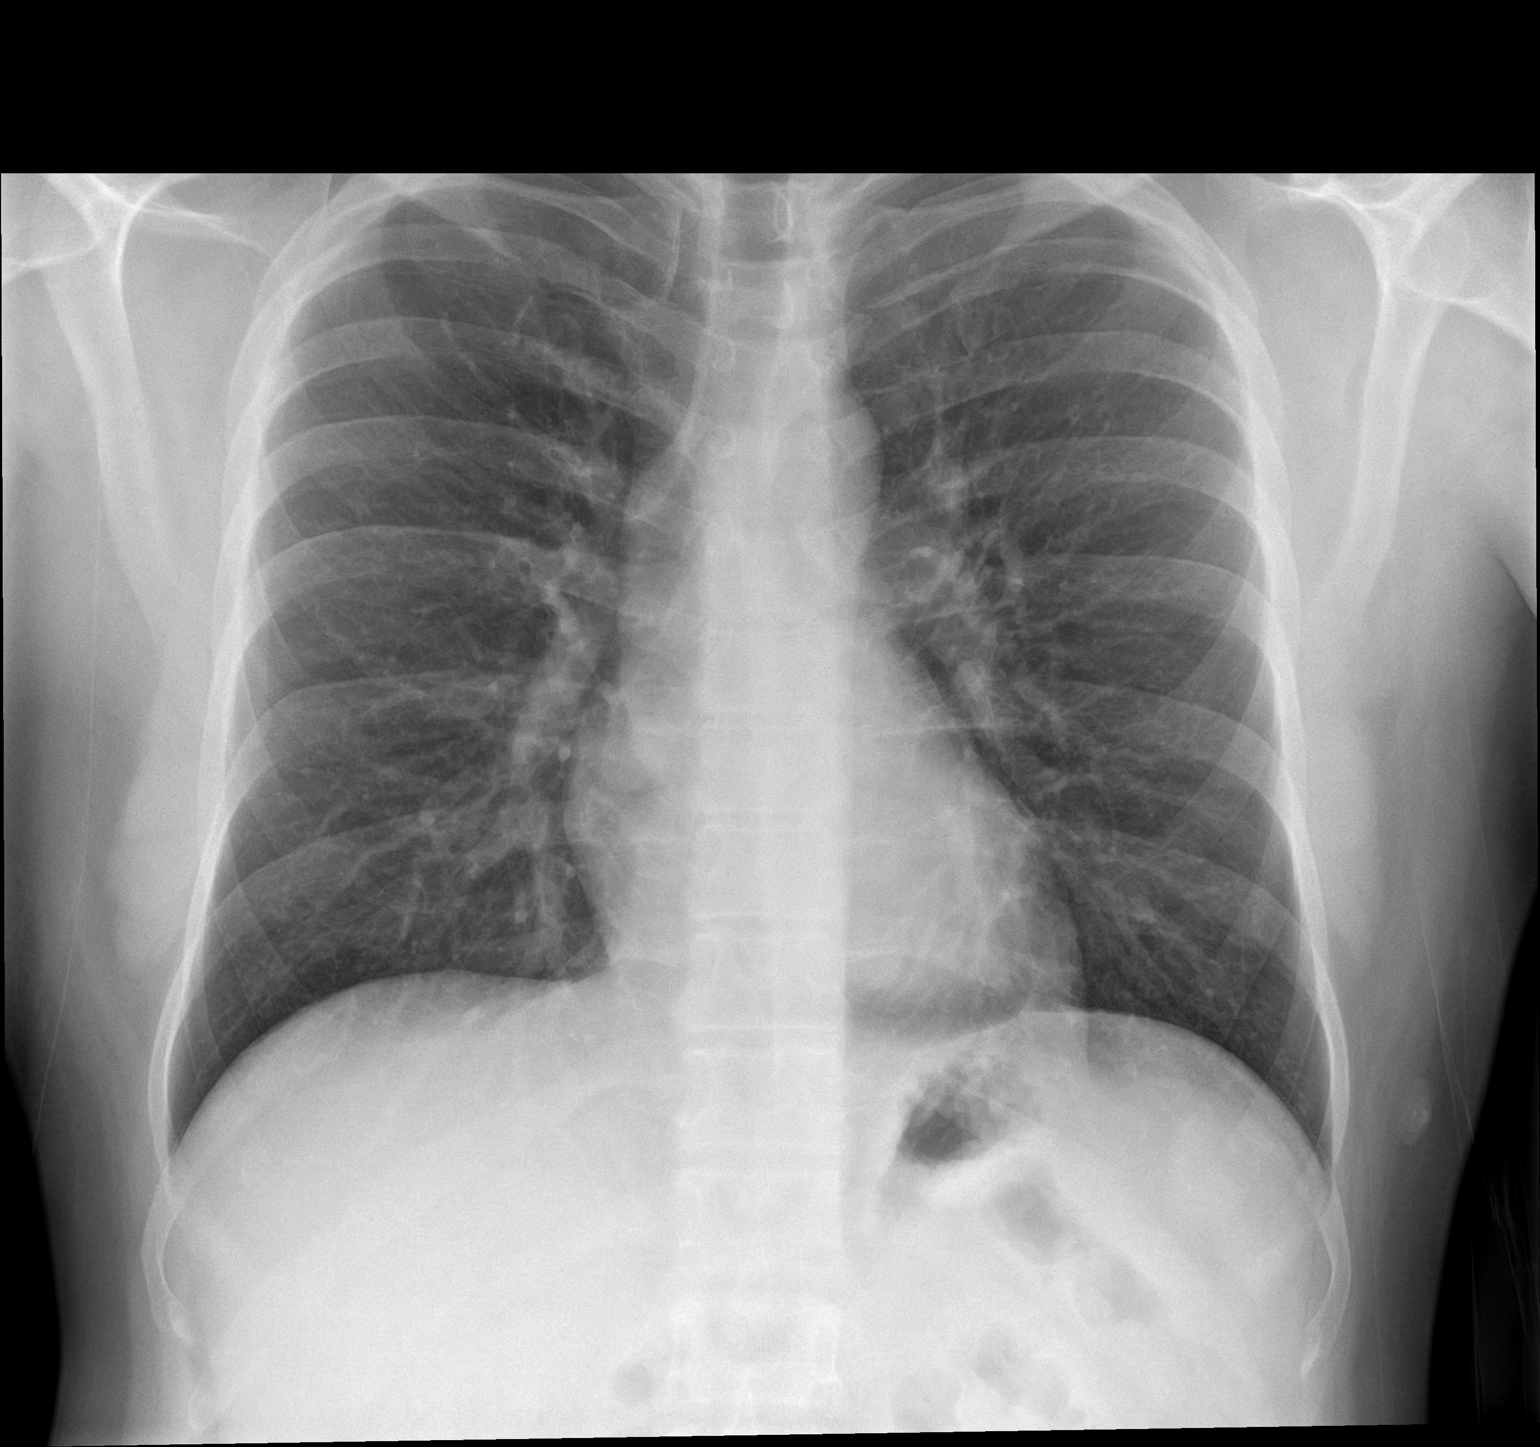

[chest lat]
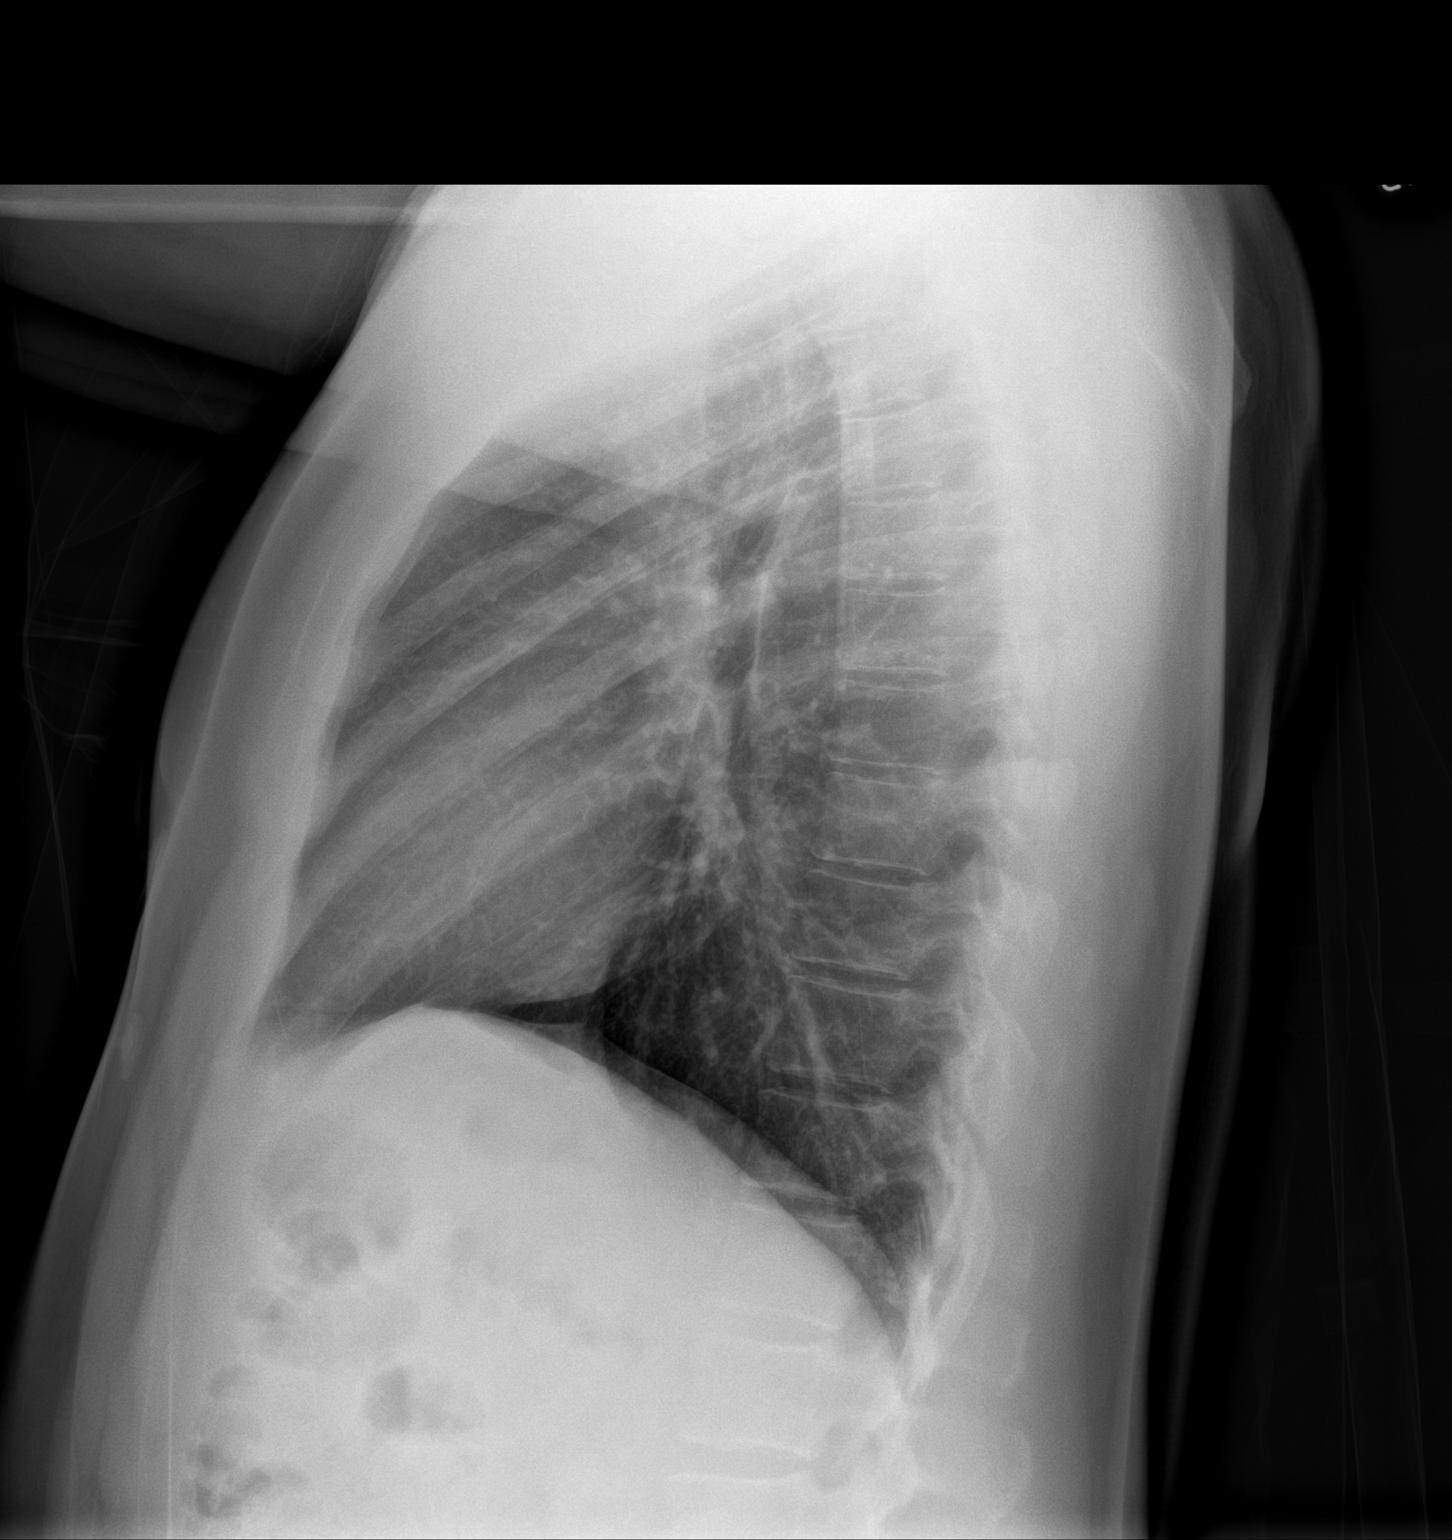

[2 of 2 positions shown; findings below may reference images not displayed]

FINDINGS: The heart size and mediastinal contours are within normal limits.
Both lungs are clear. The visualized skeletal structures are
unremarkable.
IMPRESSION: No active cardiopulmonary disease.

## 2023-02-17 ENCOUNTER — Encounter: Payer: Self-pay | Admitting: Family Medicine

## 2023-02-17 ENCOUNTER — Ambulatory Visit (INDEPENDENT_AMBULATORY_CARE_PROVIDER_SITE_OTHER): Payer: 59 | Admitting: Family Medicine

## 2023-02-17 VITALS — BP 120/82 | HR 60 | Ht 69.0 in | Wt 186.2 lb

## 2023-02-17 DIAGNOSIS — K648 Other hemorrhoids: Secondary | ICD-10-CM | POA: Diagnosis not present

## 2023-02-17 DIAGNOSIS — Z0000 Encounter for general adult medical examination without abnormal findings: Secondary | ICD-10-CM | POA: Diagnosis not present

## 2023-02-17 DIAGNOSIS — R151 Fecal smearing: Secondary | ICD-10-CM | POA: Diagnosis not present

## 2023-02-17 DIAGNOSIS — J301 Allergic rhinitis due to pollen: Secondary | ICD-10-CM

## 2023-02-17 DIAGNOSIS — K219 Gastro-esophageal reflux disease without esophagitis: Secondary | ICD-10-CM | POA: Diagnosis not present

## 2023-02-17 DIAGNOSIS — M722 Plantar fascial fibromatosis: Secondary | ICD-10-CM | POA: Diagnosis not present

## 2023-02-17 DIAGNOSIS — Z833 Family history of diabetes mellitus: Secondary | ICD-10-CM | POA: Diagnosis not present

## 2023-02-17 DIAGNOSIS — J4520 Mild intermittent asthma, uncomplicated: Secondary | ICD-10-CM | POA: Diagnosis not present

## 2023-02-17 NOTE — Progress Notes (Signed)
Complete physical exam  Patient: Richard Trujillo   DOB: 09-Oct-1990   32 y.o. Male  MRN: [id number]  Subjective:    Chief Complaint  Patient presents with   Annual Exam    CPE fasting labs, no other issues,     Richard Trujillo is a 32 y.o. male who presents today for a complete physical exam. He reports consuming a  no diet  diet. The patient has a physically strenuous job, but has no regular exercise apart from work.  He generally feels well. He reports sleeping well. He does have underlying allergies and these seem to be under fairly good control.  He still does have some difficulty with soiling is doing as well as he can under the present circumstances.  He has seen GI in the past.  He rarely has difficulty with reflux disease.  Recently he has been dealing with what he thinks is plantar fasciitis and he is now wearing shoes that do give him some support.  He has also had difficulty in the past with various aches and pains especially in his knees.  Most recent fall risk assessment:    02/17/2023    9:50 AM  Fall Risk   Falls in the past year? 0  Number falls in past yr: 0  Injury with Fall? 0  Risk for fall due to : No Fall Risks  Follow up Falls evaluation completed     Most recent depression screenings:    02/17/2023    9:50 AM 01/03/2022    8:31 AM  PHQ 2/9 Scores  PHQ - 2 Score 0 0        Patient Care Team: Ronnald Nian, MD as PCP - General (Family Medicine)   Outpatient Medications Prior to Visit  Medication Sig   acetaminophen (TYLENOL) 500 MG tablet Take 500-1,000 mg by mouth as needed. (Patient not taking: Reported on 03/23/2022)   albuterol (VENTOLIN HFA) 108 (90 Base) MCG/ACT inhaler Inhale 2 puffs into the lungs every 6 (six) hours as needed for wheezing or shortness of breath. (Patient not taking: Reported on 02/17/2023)   calcium carbonate (TUMS - DOSED IN MG ELEMENTAL CALCIUM) 500 MG chewable tablet Chew 1 tablet by mouth as needed for indigestion or  heartburn. (Patient not taking: Reported on 02/17/2023)   sulfamethoxazole-trimethoprim (BACTRIM DS) 800-160 MG tablet Take 1 tablet by mouth 2 (two) times daily. (Patient not taking: Reported on 02/17/2023)   No facility-administered medications prior to visit.    Review of Systems  All other systems reviewed and are negative.         Objective:     BP 120/82   Pulse 60   Ht 5\' 9"  (1.753 m)   Wt 186 lb 3.2 oz (84.5 kg)   BMI 27.50 kg/m    Physical Exam  Alert and in no distress. Tympanic membranes and canals are normal. Pharyngeal area is normal. Neck is supple without adenopathy or thyromegaly. Cardiac exam shows a regular sinus rhythm without murmurs or gallops. Lungs are clear to auscultation.      Assessment & Plan:    Seasonal allergic rhinitis due to pollen  Mild intermittent asthma without complication  Soiling  Internal hemorrhoids  Gastroesophageal reflux disease without esophagitis  Family history of diabetes mellitus  Plantar fasciitis of left foot Discussed getting lab data and at this point we will hold off on that.  Recommend a good rehab program for his knees including full extension type exercises.  Also mentioned dietary modification and mentioned the use of the Mediterranean diet.  Discussed the plantar fasciitis and demonstrated some techniques to help with this.  He will return here if he has further questions about any of these. Immunization History  Administered Date(s) Administered   DTaP 08/11/1989, 10/20/1989, 02/20/1990, 07/05/1991, 03/16/1995   HIB (PRP-OMP) 08/19/1990, 12/16/1990   IPV 08/11/1989, 10/20/1989, 07/05/1991, 03/16/1995   Influenza Split 06/02/2011, 07/02/2012, 05/25/2015   Influenza-Unspecified 05/29/2014, 06/08/2018, 06/10/2019, 05/17/2021   MMR 11/28/1990, 03/16/1995   PFIZER Comirnaty(Gray Top)Covid-19 Tri-Sucrose Vaccine 02/12/2021   PFIZER(Purple Top)SARS-COV-2 Vaccination 09/19/2019, 10/09/2019   PPD Test  09/20/2010, 02/28/2011, 03/28/2011, 07/02/2012   Td 01/21/2003   Tdap 01/21/2003, 12/07/2012    Health Maintenance  Topic Date Due   HIV Screening  Never done   COVID-19 Vaccine (4 - 2023-24 season) 04/29/2022   DTaP/Tdap/Td (9 - Td or Tdap) 12/08/2022   INFLUENZA VACCINE  03/30/2023   Hepatitis C Screening  Completed   HPV VACCINES  Aged Out    Discussed health benefits of physical activity, and encouraged him to engage in regular exercise appropriate for his age and condition.  Problem List Items Addressed This Visit     Family history of diabetes mellitus   Gastroesophageal reflux disease without esophagitis   Internal hemorrhoids   Mild intermittent asthma without complication   Seasonal allergic rhinitis due to pollen - Primary   Soiling   Other Visit Diagnoses     Plantar fasciitis of left foot          Follow-up as needed or in 1 year    Sharlot Gowda, MD

## 2023-02-21 ENCOUNTER — Telehealth: Payer: Self-pay | Admitting: Family Medicine

## 2023-02-21 ENCOUNTER — Other Ambulatory Visit (HOSPITAL_BASED_OUTPATIENT_CLINIC_OR_DEPARTMENT_OTHER): Payer: Self-pay

## 2023-02-21 MED ORDER — BENZONATATE 100 MG PO CAPS
100.0000 mg | ORAL_CAPSULE | Freq: Two times a day (BID) | ORAL | 0 refills | Status: DC | PRN
  Filled 2023-02-21: qty 20, 10d supply, fill #0

## 2023-02-21 NOTE — Telephone Encounter (Signed)
Pt called and states that he has a dry cough and has had it for about 4 weeks, and wants to know if you will send him in some cough pearls I infomred him that he needs a visit as he has not been treated but he wanted me to ask, since he was just here for a cpe,  Pt uses [facility]

## 2023-02-28 ENCOUNTER — Other Ambulatory Visit (HOSPITAL_BASED_OUTPATIENT_CLINIC_OR_DEPARTMENT_OTHER): Payer: Self-pay

## 2023-02-28 ENCOUNTER — Ambulatory Visit (INDEPENDENT_AMBULATORY_CARE_PROVIDER_SITE_OTHER): Payer: 59

## 2023-02-28 ENCOUNTER — Ambulatory Visit (HOSPITAL_COMMUNITY)
Admission: RE | Admit: 2023-02-28 | Discharge: 2023-02-28 | Disposition: A | Discharge status: Home / Self Care | Payer: 59 | Source: Ambulatory Visit | Attending: Emergency Medicine | Admitting: Emergency Medicine

## 2023-02-28 ENCOUNTER — Encounter (HOSPITAL_COMMUNITY): Payer: Self-pay

## 2023-02-28 VITALS — BP 142/92 | HR 88 | Temp 98.0°F | Resp 16

## 2023-02-28 DIAGNOSIS — J209 Acute bronchitis, unspecified: Secondary | ICD-10-CM | POA: Diagnosis not present

## 2023-02-28 DIAGNOSIS — R059 Cough, unspecified: Secondary | ICD-10-CM | POA: Diagnosis not present

## 2023-02-28 MED ORDER — BENZONATATE 200 MG PO CAPS
200.0000 mg | ORAL_CAPSULE | Freq: Three times a day (TID) | ORAL | 0 refills | Status: AC
  Filled 2023-02-28: qty 30, 10d supply, fill #0

## 2023-02-28 MED ORDER — PREDNISONE 20 MG PO TABS
40.0000 mg | ORAL_TABLET | Freq: Every day | ORAL | 0 refills | Status: AC
  Filled 2023-02-28: qty 10, 5d supply, fill #0

## 2023-02-28 MED ORDER — PROMETHAZINE-DM 6.25-15 MG/5ML PO SYRP
5.0000 mL | ORAL_SOLUTION | Freq: Four times a day (QID) | ORAL | 0 refills | Status: DC | PRN
  Filled 2023-02-28: qty 118, 6d supply, fill #0

## 2023-02-28 NOTE — ED Triage Notes (Signed)
Pt states cough for the past 3-4 weeks. Has been taking Tessalon at home with some relief.

## 2023-02-28 NOTE — ED Provider Notes (Signed)
[facility]    CSN: [id number] Arrival date & time: 02/28/23  1357      History   Chief Complaint Chief Complaint  Patient presents with   Cough    Dry cough ongoing for almost 4 weeks. Has gotten worse in the last week. It is made worse by heat and activity. Difficult to take a deep breath at times after coughing spell. - Entered by patient    HPI Richard Trujillo is a 32 y.o. male.   Patient presents to clinic for complaints of a dry nonproductive cough that has been ongoing for the past 4 weeks.  Reports his cough is worsened when he gets hot.  He denies any shortness of breath, reports he gets constantly wheezing and is unable to tell if this is different from his baseline.  He does take Tums as needed for acid reflux.  Denies fevers or fatigue.  Denies sore throat or recent illness.  Denies any changes to his cough.  Has been taking Tessalon Perles with some minor relief.  He does not smoke, no history of respiratory illnesses.   The history is provided by the patient and medical records.  Cough Associated symptoms: wheezing   Associated symptoms: no chest pain, no fever, no shortness of breath and no sore throat     Past Medical History:  Diagnosis Date   Gastritis    GERD (gastroesophageal reflux disease)    Hiatal hernia    History of EMG 01/20/12   EMG/NCS   Neck pain    PT and neurology consults 12/2011    Patient Active Problem List   Diagnosis Date Noted   Family history of diabetes mellitus 01/03/2022   Soiling 01/03/2022   Mild intermittent asthma without complication 12/22/2020   Seasonal allergic rhinitis due to pollen 12/22/2020   Internal hemorrhoids 12/22/2020   History of COVID-19 12/22/2020   Gastroesophageal reflux disease without esophagitis 09/24/2018    Past Surgical History:  Procedure Laterality Date   ESOPHAGOGASTRODUODENOSCOPY  10/24/11   Dr. Rhea Belton       Home Medications    Prior to Admission medications    Medication Sig Start Date End Date Taking? Authorizing Provider  benzonatate (TESSALON) 200 MG capsule Take 1 capsule (200 mg total) by mouth every 8 (eight) hours for 10 days. 02/28/23 03/10/23 Yes Rinaldo Ratel, [place] N, FNP  predniSONE (DELTASONE) 20 MG tablet Take 2 tablets (40 mg total) by mouth daily with breakfast for 5 days. 02/28/23 03/05/23 Yes Rinaldo Ratel, [place] N, FNP  promethazine-dextromethorphan (PROMETHAZINE-DM) 6.25-15 MG/5ML syrup Take 5 mLs by mouth 4 (four) times daily as needed for cough. 02/28/23  Yes Rinaldo Ratel, [place] N, FNP  acetaminophen (TYLENOL) 500 MG tablet Take 500-1,000 mg by mouth as needed. Patient not taking: Reported on 03/23/2022    [provider]  albuterol (VENTOLIN HFA) 108 (90 Base) MCG/ACT inhaler Inhale 2 puffs into the lungs every 6 (six) hours as needed for wheezing or shortness of breath. Patient not taking: Reported on 02/17/2023 03/23/22   Tysinger, Kermit Balo, PA-C  calcium carbonate (TUMS - DOSED IN MG ELEMENTAL CALCIUM) 500 MG chewable tablet Chew 1 tablet by mouth as needed for indigestion or heartburn. Patient not taking: Reported on 02/17/2023    [provider]  sulfamethoxazole-trimethoprim (BACTRIM DS) 800-160 MG tablet Take 1 tablet by mouth 2 (two) times daily. Patient not taking: Reported on 02/17/2023 11/23/22   Ronnald Nian, MD    Family History Family History  Problem Relation  Age of Onset   Heart disease Paternal Grandmother    Cancer Paternal Aunt    Colon cancer Neg Hx    Colon polyps Neg Hx    Esophageal cancer Neg Hx    Rectal cancer Neg Hx    Stomach cancer Neg Hx     Social History Social History   Tobacco Use   Smoking status: Never   Smokeless tobacco: Never  Vaping Use   Vaping Use: Never used  Substance Use Topics   Alcohol use: No    Alcohol/week: 0.0 standard drinks of alcohol   Drug use: No     Allergies   Naproxen, Prilosec [omeprazole magnesium], and Pseudoephedrine   Review of Systems Review  of Systems  Constitutional:  Negative for fever.  HENT:  Negative for congestion and sore throat.   Respiratory:  Positive for cough and wheezing. Negative for shortness of breath.   Cardiovascular:  Negative for chest pain.  Gastrointestinal:  Negative for abdominal pain.     Physical Exam Triage Vital Signs ED Triage Vitals  Enc Vitals Group     BP 02/28/23 1411 (!) 142/92     Pulse Rate 02/28/23 1411 88     Resp 02/28/23 1411 16     Temp 02/28/23 1411 98 F (36.7 C)     Temp Source 02/28/23 1411 Oral     SpO2 02/28/23 1411 98 %     Weight --      Height --      Head Circumference --      Peak Flow --      Pain Score 02/28/23 1412 0     Pain Loc --      Pain Edu? --      Excl. in GC? --    No data found.  Updated Vital Signs BP (!) 142/92 (BP Location: Right Arm)   Pulse 88   Temp 98 F (36.7 C) (Oral)   Resp 16   SpO2 98%   Visual Acuity Right Eye Distance:   Left Eye Distance:   Bilateral Distance:    Right Eye Near:   Left Eye Near:    Bilateral Near:     Physical Exam Vitals and nursing note reviewed.  Constitutional:      Appearance: Normal appearance.  HENT:     Head: Normocephalic and atraumatic.     Right Ear: External ear normal.     Left Ear: External ear normal.     Nose: Nose normal.     Mouth/Throat:     Mouth: Mucous membranes are moist.  Eyes:     Conjunctiva/sclera: Conjunctivae normal.  Cardiovascular:     Rate and Rhythm: Normal rate and regular rhythm.     Heart sounds: Normal heart sounds, S1 normal and S2 normal. No murmur heard. Pulmonary:     Effort: Pulmonary effort is normal.     Breath sounds: Wheezing present.     Comments: Expiratory wheezing in upper lobes. Musculoskeletal:        General: Normal range of motion.  Skin:    General: Skin is warm and dry.  Neurological:     General: No focal deficit present.     Mental Status: He is alert and oriented to person, place, and time.  Psychiatric:        Mood and  Affect: Mood normal.        Behavior: Behavior is cooperative.      UC Treatments / Results  Labs (all  labs ordered are listed, but only abnormal results are displayed) Labs Reviewed - No data to display  EKG   Radiology No results found.  Procedures Procedures (including critical care time)  Medications Ordered in UC Medications - No data to display  Initial Impression / Assessment and Plan / UC Course  I have reviewed the triage vital signs and the nursing notes.  Pertinent labs & imaging results that were available during my care of the patient were reviewed by me and considered in my medical decision making (see chart for details).  Vitals and triage reviewed, patient is hemodynamically stable.  Oxygenation 98% on room air, does have scattered expiratory wheezing in upper lobes.  Chest x-ray does not show any acute consolidation, awaiting official radiology over read.  Discussed symptoms are consistent with bronchitis, sent steroid burst and cough medication.  Plan of care, follow-up care and return precautions given, no questions at this time.    Final Clinical Impressions(s) / UC Diagnoses   Final diagnoses:  Acute bronchitis, unspecified organism     Discharge Instructions      Your chest x-ray did not show any pneumonia or acute consolidation.  I believe you have bronchitis.  Please take steroids in the morning with breakfast for the next 5 days.  You can use the Tessalon Perles throughout the day and the cough syrup at night.  Do not drink or drive on the cough syrup as it may make you drowsy.    Please return to clinic or follow-up with your primary care provider if you do not have any improvement over the next week or 2, develop fever, shortness of breath, or any new concerning symptoms.     ED Prescriptions     Medication Sig Dispense Auth. Provider   benzonatate (TESSALON) 200 MG capsule Take 1 capsule (200 mg total) by mouth every 8 (eight) hours for  10 days. 30 capsule Rinaldo Ratel, [place] N, [place]   promethazine-dextromethorphan (PROMETHAZINE-DM) 6.25-15 MG/5ML syrup Take 5 mLs by mouth 4 (four) times daily as needed for cough. 118 mL Rinaldo Ratel, [place] N, [place]   predniSONE (DELTASONE) 20 MG tablet Take 2 tablets (40 mg total) by mouth daily with breakfast for 5 days. 10 tablet Mikayela Deats, [place] N, FNP      PDMP not reviewed this encounter.   Krystopher Kuenzel, [place] N, [place] 02/28/23 1505

## 2023-02-28 NOTE — Discharge Instructions (Signed)
Your chest x-ray did not show any pneumonia or acute consolidation.  I believe you have bronchitis.  Please take steroids in the morning with breakfast for the next 5 days.  You can use the Tessalon Perles throughout the day and the cough syrup at night.  Do not drink or drive on the cough syrup as it may make you drowsy.    Please return to clinic or follow-up with your primary care provider if you do not have any improvement over the next week or 2, develop fever, shortness of breath, or any new concerning symptoms.

## 2023-06-28 ENCOUNTER — Other Ambulatory Visit (HOSPITAL_BASED_OUTPATIENT_CLINIC_OR_DEPARTMENT_OTHER): Payer: Self-pay

## 2023-06-28 ENCOUNTER — Ambulatory Visit: Payer: 59 | Admitting: Family Medicine

## 2023-06-28 ENCOUNTER — Encounter: Payer: Self-pay | Admitting: Family Medicine

## 2023-06-28 ENCOUNTER — Encounter: Payer: Self-pay | Admitting: Internal Medicine

## 2023-06-28 VITALS — BP 122/80 | HR 62 | Temp 97.6°F | Wt 190.2 lb

## 2023-06-28 DIAGNOSIS — K602 Anal fissure, unspecified: Secondary | ICD-10-CM

## 2023-06-28 DIAGNOSIS — Z23 Encounter for immunization: Secondary | ICD-10-CM

## 2023-06-28 MED ORDER — DILTIAZEM GEL 2 %
1.0000 | Freq: Two times a day (BID) | CUTANEOUS | 5 refills | Status: AC
  Filled 2023-06-28: qty 20, 10d supply, fill #0

## 2023-06-28 NOTE — Progress Notes (Signed)
Subjective:    Patient ID: Richard Trujillo, male    DOB: 04/13/1991, 32 y.o.   MRN: [id number]  HPI He is again having difficulty with rectal discomfort.  He describes itching as well as occasionally seeing some bleeding and pain when having a BM.  He has seen GI in the past because of rectal issues. Review of record indicates need for tetanus.  Review of Systems     Objective:    Physical Exam Anal exam does show a fissure present at 11:00.  It appears to be healing slightly.  No bleeding was noted.       Assessment & Plan:  Anal fissure - Plan: diltiazem 2 % GEL  Need for Tdap vaccination - Plan: Tdap vaccine greater than or equal to 7yo IM  I discussed treatment of the anal fissure with using the diltiazem and also making sure that he has softer BMs.  If he continues have difficulty, referral to general surgery would be needed.

## 2023-08-14 ENCOUNTER — Telehealth: Payer: 59 | Admitting: Nurse Practitioner

## 2023-08-14 DIAGNOSIS — B029 Zoster without complications: Secondary | ICD-10-CM

## 2023-08-14 MED ORDER — VALACYCLOVIR HCL 1 G PO TABS
1000.0000 mg | ORAL_TABLET | Freq: Three times a day (TID) | ORAL | 0 refills | Status: AC
  Filled 2023-08-14: qty 21, 7d supply, fill #0

## 2023-08-14 MED ORDER — TRIAMCINOLONE ACETONIDE 0.1 % EX CREA
1.0000 | TOPICAL_CREAM | Freq: Two times a day (BID) | CUTANEOUS | 0 refills | Status: AC
  Filled 2023-08-14: qty 30, 15d supply, fill #0

## 2023-08-14 MED ORDER — GABAPENTIN 300 MG PO CAPS
300.0000 mg | ORAL_CAPSULE | Freq: Every evening | ORAL | 0 refills | Status: AC | PRN
  Filled 2023-08-14: qty 10, 10d supply, fill #0

## 2023-08-14 NOTE — Progress Notes (Signed)
E-visit for Shingles   We are sorry that you are not feeling well. Here is how we plan to help!  Based on what you shared with me it looks like you have shingles.  Shingles or herpes zoster, is a common infection of the nerves.  It is a painful rash caused by the herpes zoster virus.  This is the same virus that causes chickenpox.  After a person has chickenpox, the virus remains inactive in the nerve cells.  Years later, the virus can become active again and travel to the skin.  It typically will appear on one side of the face or body.  Burning or shooting pain, tingling, or itching are early signs of the infection.  Blisters typically scab over in 7 to 10 days and clear up within 2-4 weeks. Shingles is only contagious to people that have never had the chickenpox, the chickenpox vaccine, or anyone who has a compromised immune system.  You should avoid contact with these type of people until your blisters scab over.  I have prescribed Valacyclovir 1g three times daily for 7 days and also Gabapentin 300mg   daily as needed for pain  We will also prescribe a topical steroid to help with the itching, an over the counter allergy medication may also be helpful- like Zyrtec   Meds ordered this encounter  Medications   valACYclovir (VALTREX) 1000 MG tablet    Sig: Take 1 tablet (1,000 mg total) by mouth 3 (three) times daily for 7 days.    Dispense:  21 tablet    Refill:  0   triamcinolone cream (KENALOG) 0.1 %    Sig: Apply 1 Application topically 2 (two) times daily.    Dispense:  30 g    Refill:  0   gabapentin (NEURONTIN) 300 MG capsule    Sig: Take 1 capsule (300 mg total) by mouth at bedtime as needed for up to 10 days (pain).    Dispense:  10 capsule    Refill:  0       HOME CARE: Apply ice packs (wrapped in a thin towel), cool compresses, or soak in cool bath to help reduce pain. Use calamine lotion to calm itchy skin. Avoid scratching the rash. Avoid direct sunlight.  GET HELP  RIGHT AWAY IF: Symptoms that don't away after treatment. A rash or blisters near your eye. Increased drainage, fever, or rash after treatment. Severe pain that doesn't go away.   MAKE SURE YOU   Understand these instructions. Will watch your condition. Will get help right away if you are not doing well or get worse.  Thank you for choosing an e-visit.  Your e-visit answers were reviewed by a board certified advanced clinical practitioner to complete your personal care plan. Depending upon the condition, your plan could have included both over the counter or prescription medications.  Please review your pharmacy choice. Make sure the pharmacy is open so you can pick up prescription now. If there is a problem, you may contact your provider through [employer] and have the prescription routed to another pharmacy.  Your safety is important to [place]. If you have drug allergies check your prescription carefully.   For the next 24 hours you can use MyChart to ask questions about today's visit, request a non-urgent call back, or ask for a work or school excuse. You will get an email in the next two days asking about your experience. I hope that your e-visit has been valuable and will speed  your recovery.   I spent approximately 5 minutes reviewing the patient's history, current symptoms and coordinating their care today.

## 2023-08-15 ENCOUNTER — Other Ambulatory Visit (HOSPITAL_BASED_OUTPATIENT_CLINIC_OR_DEPARTMENT_OTHER): Payer: Self-pay

## 2023-08-15 ENCOUNTER — Other Ambulatory Visit: Payer: Self-pay

## 2023-10-24 ENCOUNTER — Encounter: Payer: Self-pay | Admitting: Internal Medicine

## 2024-03-06 ENCOUNTER — Encounter: Payer: Self-pay | Admitting: Family Medicine

## 2024-03-06 ENCOUNTER — Ambulatory Visit: Payer: 59 | Admitting: Family Medicine

## 2024-03-06 VITALS — BP 128/80 | HR 67 | Ht 69.0 in | Wt 190.2 lb

## 2024-03-06 DIAGNOSIS — Z0000 Encounter for general adult medical examination without abnormal findings: Secondary | ICD-10-CM

## 2024-03-06 DIAGNOSIS — J4520 Mild intermittent asthma, uncomplicated: Secondary | ICD-10-CM

## 2024-03-06 DIAGNOSIS — M542 Cervicalgia: Secondary | ICD-10-CM

## 2024-03-06 DIAGNOSIS — J301 Allergic rhinitis due to pollen: Secondary | ICD-10-CM | POA: Diagnosis not present

## 2024-03-06 DIAGNOSIS — R151 Fecal smearing: Secondary | ICD-10-CM

## 2024-03-06 DIAGNOSIS — Z13220 Encounter for screening for lipoid disorders: Secondary | ICD-10-CM

## 2024-03-06 DIAGNOSIS — K219 Gastro-esophageal reflux disease without esophagitis: Secondary | ICD-10-CM

## 2024-03-06 DIAGNOSIS — Z833 Family history of diabetes mellitus: Secondary | ICD-10-CM

## 2024-03-06 LAB — LIPID PANEL

## 2024-03-06 NOTE — Progress Notes (Signed)
 Complete physical exam  Patient: Richard Trujillo   DOB: 12/05/1990   33 y.o. Male  MRN: [id number]  Subjective:    Chief Complaint  Patient presents with   Annual Exam    Cpe. Has shingles, wants to know about shingles shot. Plantar fasciitis on feet, feels like walking on bone. Knee pain. Still having bottom issues.     Richard Trujillo is a 33 y.o. male who presents today for a complete physical exam.  He reports consuming a general diet.  He is trying to control his weight but has had difficulty with that. Does participates in physical activity along with having two young kids  He generally feels well. He reports sleeping well. He has had some difficulty recently with foot pain and thinks that it is plantar fasciitis.  He is trying some modalities to help with that.  He has also had some difficulty with soiling continues to bother him.  He states he has a previous history of neck pain with x-rays and still has occasionally difficulty with that.  Reflux is causing very little difficulty and he does occasionally use albuterol . Most recent fall risk assessment:    03/06/2024    9:40 AM  Fall Risk   Falls in the past year? 0  Number falls in past yr: 0  Injury with Fall? 0  Risk for fall due to : No Fall Risks  Follow up Falls evaluation completed     Most recent depression screenings:    03/06/2024    9:40 AM 02/17/2023    9:50 AM  PHQ 2/9 Scores  PHQ - 2 Score 0 0    Vision:Not within last year  and Dental: No current dental problems and Receives regular dental care    Immunization History  Administered Date(s) Administered   DTaP 08/11/1989, 10/20/1989, 02/20/1990, 07/05/1991, 03/16/1995   HIB (PRP-OMP) 08/19/1990, 12/16/1990   IPV 08/11/1989, 10/20/1989, 07/05/1991, 03/16/1995   Influenza Split 06/02/2011, 07/02/2012, 05/25/2015   Influenza-Unspecified 05/29/2014, 06/08/2018, 06/10/2019, 05/17/2021, 06/13/2023   MMR 11/28/1990, 03/16/1995   PFIZER Comirnaty(Gray  Top)Covid-19 Tri-Sucrose Vaccine 02/12/2021   PFIZER(Purple Top)SARS-COV-2 Vaccination 09/19/2019, 10/09/2019   PPD Test 09/20/2010, 02/28/2011, 03/28/2011, 07/02/2012   Td 01/21/2003   Tdap 01/21/2003, 12/07/2012, 06/28/2023    Health Maintenance  Topic Date Due   HIV Screening  Never done   Pneumococcal Vaccine 34-56 Years old (1 of 2 - PCV) Never done   Hepatitis B Vaccines (1 of 3 - 19+ 3-dose series) Never done   HPV VACCINES (1 - 3-dose SCDM series) Never done   COVID-19 Vaccine (4 - 2024-25 season) 04/30/2023   INFLUENZA VACCINE  03/29/2024   DTaP/Tdap/Td (10 - Td or Tdap) 06/27/2033   Hepatitis C Screening  Completed   Meningococcal B Vaccine  Aged Out    Patient Care Team: Joyce Norleen BROCKS, MD as PCP - General (Family Medicine)   Outpatient Medications Prior to Visit  Medication Sig   albuterol  (VENTOLIN  HFA) 108 (90 Base) MCG/ACT inhaler Inhale 2 puffs into the lungs every 6 (six) hours as needed for wheezing or shortness of breath.   diltiazem  2 % GEL Apply 1 Application topically 2 (two) times daily. (Patient not taking: Reported on 03/06/2024)   gabapentin  (NEURONTIN ) 300 MG capsule Take 1 capsule (300 mg total) by mouth at bedtime as needed for up to 10 days (pain). (Patient not taking: Reported on 03/06/2024)   triamcinolone  cream (KENALOG ) 0.1 % Apply 1 Application topically 2 (two) times daily. (  Patient not taking: Reported on 03/06/2024)   No facility-administered medications prior to visit.    Review of Systems  All other systems reviewed and are negative.   Family and social history as well as health maintenance and immunizations was reviewed.     Objective:    BP 128/80   Pulse 67   Ht 5' 9 (1.753 m)   Wt 190 lb 3.2 oz (86.3 kg)   SpO2 97%   BMI 28.09 kg/m    Physical Exam  Alert and in no distress. Tympanic membranes and canals are normal. Pharyngeal area is normal. Neck is supple without adenopathy or thyromegaly. Cardiac exam shows a regular sinus  rhythm without murmurs or gallops. Lungs are clear to auscultation.  Abdominal exam shows no masses or tenderness with normal bowel sounds.      Assessment & Plan:    Discussed health benefits of physical activity, and encouraged him to engage in regular exercise appropriate for his age and condition.  Routine general medical examination at a health care facility  Soiling - Plan: Ambulatory referral to Gastroenterology  Seasonal allergic rhinitis due to pollen  Mild intermittent asthma without complication  Gastroesophageal reflux disease without esophagitis - Plan: CBC with Differential/Platelet, Comprehensive metabolic panel with GFR  Family history of diabetes mellitus - Plan: CBC with Differential/Platelet, Comprehensive metabolic panel with GFR  Screening for lipid disorders - Plan: Lipid panel  Neck pain  Since he continues to have difficulty with swelling, I will refer him back to Dr. Albertus to see if there is any else that can be done. Return in about 1 year (around 03/06/2025).      Norleen Jobs, MD

## 2024-03-07 ENCOUNTER — Ambulatory Visit: Payer: Self-pay | Admitting: Family Medicine

## 2024-03-07 LAB — COMPREHENSIVE METABOLIC PANEL WITH GFR
ALT: 29 IU/L (ref 0–44)
AST: 23 IU/L (ref 0–40)
Albumin: 4.8 g/dL (ref 4.1–5.1)
Alkaline Phosphatase: 89 IU/L (ref 44–121)
BUN/Creatinine Ratio: 13 (ref 9–20)
BUN: 12 mg/dL (ref 6–20)
Bilirubin Total: 0.4 mg/dL (ref 0.0–1.2)
CO2: 21 mmol/L (ref 20–29)
Calcium: 9.9 mg/dL (ref 8.7–10.2)
Chloride: 101 mmol/L (ref 96–106)
Creatinine, Ser: 0.95 mg/dL (ref 0.76–1.27)
Globulin, Total: 3.3 g/dL (ref 1.5–4.5)
Glucose: 81 mg/dL (ref 70–99)
Potassium: 4.4 mmol/L (ref 3.5–5.2)
Sodium: 140 mmol/L (ref 134–144)
Total Protein: 8.1 g/dL (ref 6.0–8.5)
eGFR: 108 mL/min/1.73 (ref >59)

## 2024-03-07 LAB — CBC WITH DIFFERENTIAL/PLATELET
Basophils Absolute: 0.1 x10E3/uL (ref 0.0–0.2)
Basos: 1 %
EOS (ABSOLUTE): 0.2 x10E3/uL (ref 0.0–0.4)
Eos: 2 %
Hematocrit: 47.2 % (ref 37.5–51.0)
Hemoglobin: 15.4 g/dL (ref 13.0–17.7)
Immature Grans (Abs): 0 x10E3/uL (ref 0.0–0.1)
Immature Granulocytes: 0 %
Lymphocytes Absolute: 2.2 x10E3/uL (ref 0.7–3.1)
Lymphs: 35 %
MCH: 29.4 pg (ref 26.6–33.0)
MCHC: 32.6 g/dL (ref 31.5–35.7)
MCV: 90 fL (ref 79–97)
Monocytes Absolute: 0.6 x10E3/uL (ref 0.1–0.9)
Monocytes: 9 %
Neutrophils Absolute: 3.2 x10E3/uL (ref 1.4–7.0)
Neutrophils: 53 %
Platelets: 353 x10E3/uL (ref 150–450)
RBC: 5.23 x10E6/uL (ref 4.14–5.80)
RDW: 12.6 % (ref 11.6–15.4)
WBC: 6.2 x10E3/uL (ref 3.4–10.8)

## 2024-03-07 LAB — LIPID PANEL
Cholesterol, Total: 157 mg/dL (ref 100–199)
HDL: 36 mg/dL — AB (ref >39)
LDL CALC COMMENT:: 4.4 ratio (ref 0.0–5.0)
LDL Chol Calc (NIH): 104 mg/dL — AB (ref 0–99)
Triglycerides: 93 mg/dL (ref 0–149)
VLDL Cholesterol Cal: 17 mg/dL (ref 5–40)

## 2025-03-10 ENCOUNTER — Encounter: Payer: Self-pay | Admitting: Family Medicine
